# Patient Record
Sex: Male | Born: 1990 | Race: Black or African American | Hispanic: No | Marital: Single | Smoking: Never smoker
Health system: Southern US, Community
[De-identification: ages and names within clinical notes are randomized; demographics above are authoritative.]

## PROBLEM LIST (undated history)

## (undated) DIAGNOSIS — E119 Type 2 diabetes mellitus without complications: Secondary | ICD-10-CM

---
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
## 2011-05-07 ENCOUNTER — Emergency Department (HOSPITAL_COMMUNITY): Payer: Self-pay

## 2011-05-07 ENCOUNTER — Emergency Department (HOSPITAL_COMMUNITY)
Admission: EM | Admit: 2011-05-07 | Discharge: 2011-05-07 | Disposition: A | Discharge status: Home / Self Care | Payer: Self-pay | Attending: Emergency Medicine | Admitting: Emergency Medicine

## 2011-05-07 DIAGNOSIS — I428 Other cardiomyopathies: Secondary | ICD-10-CM | POA: Insufficient documentation

## 2011-05-07 DIAGNOSIS — X500XXA Overexertion from strenuous movement or load, initial encounter: Secondary | ICD-10-CM | POA: Insufficient documentation

## 2011-05-07 DIAGNOSIS — R0789 Other chest pain: Secondary | ICD-10-CM | POA: Insufficient documentation

## 2019-08-09 ENCOUNTER — Other Ambulatory Visit: Payer: Self-pay

## 2019-08-09 ENCOUNTER — Encounter: Payer: Self-pay | Admitting: *Deleted

## 2019-08-09 ENCOUNTER — Ambulatory Visit
Admission: EM | Admit: 2019-08-09 | Discharge: 2019-08-09 | Disposition: A | Discharge status: Home / Self Care | Payer: Self-pay | Attending: Emergency Medicine | Admitting: Emergency Medicine

## 2019-08-09 DIAGNOSIS — L02413 Cutaneous abscess of right upper limb: Secondary | ICD-10-CM

## 2019-08-09 DIAGNOSIS — L0291 Cutaneous abscess, unspecified: Secondary | ICD-10-CM

## 2019-08-09 MED ORDER — DOXYCYCLINE HYCLATE 100 MG PO CAPS: 100.0000 mg | ORAL_CAPSULE | Freq: Two times a day (BID) | ORAL | 0 refills | Status: AC

## 2019-08-09 NOTE — ED Provider Notes (Signed)
[facility]    CSN: [id number] Arrival date & time: 08/09/19  1148      History   Chief Complaint Chief Complaint  Patient presents with  . Abscess    HPI Scott Barton is a 28 y.o. male.  Presenting for right forearm abscess x2.  States pain and swelling developed about 5 days ago.  Patient did have tattoo completed on affected area 11/5 without adverse effect.  Patient has been using Aquaphor over tattoo to help with dry skin as well as Tylenol which did not help.  Patient denies fever, open wound, drainage, inciting event, painful ROM.  History reviewed. No pertinent past medical history.  There are no active problems to display for this patient.   History reviewed. No pertinent surgical history.     Home Medications    Prior to Admission medications   Medication Sig Start Date End Date Taking? Authorizing Provider  doxycycline (VIBRAMYCIN) 100 MG capsule Take 1 capsule (100 mg total) by mouth 2 (two) times daily for 7 days. 08/09/19 08/16/19  Hall-Potvin, [place], PA-C    Family History History reviewed. No pertinent family history.  Social History Social History   Tobacco Use  . Smoking status: Never Smoker  . Smokeless tobacco: Never Used  Substance Use Topics  . Alcohol use: Yes    Comment: occ  . Drug use: Not on file     Allergies   Patient has no known allergies.   Review of Systems Review of Systems  Constitutional: Negative for fatigue and fever.  Respiratory: Negative for cough and shortness of breath.   Cardiovascular: Negative for chest pain and palpitations.  Gastrointestinal: Negative for abdominal pain, diarrhea and vomiting.  Musculoskeletal: Negative for arthralgias and myalgias.  Skin: Positive for wound. Negative for rash.  Neurological: Negative for speech difficulty and headaches.  All other systems reviewed and are negative.    Physical Exam Triage Vital Signs ED Triage Vitals  Enc Vitals Group     BP       Pulse      Resp      Temp      Temp src      SpO2      Weight      Height      Head Circumference      Peak Flow      Pain Score      Pain Loc      Pain Edu?      Excl. in [place]?    No data found.  Updated Vital Signs BP (!) 151/97 (BP Location: Left Arm)   Pulse 97   Temp 98.1 F (36.7 C) (Oral)   Resp 16   SpO2 98%   Visual Acuity Right Eye Distance:   Left Eye Distance:   Bilateral Distance:    Right Eye Near:   Left Eye Near:    Bilateral Near:     Physical Exam Constitutional:      General: He is not in acute distress. HENT:     Head: Normocephalic and atraumatic.  Eyes:     General: No scleral icterus.    Pupils: Pupils are equal, round, and reactive to light.  Cardiovascular:     Rate and Rhythm: Normal rate.  Pulmonary:     Effort: Pulmonary effort is normal.  Skin:    Comments: Right ventral forearm w/ tattoo: 2.0 cm & 1 cm round firm lesions noted. Warm, red, tender.  No fluctuance or streaking.  No skin breakdown.  Neurological:     Mental Status: He is alert and oriented to person, place, and time.      UC Treatments / Results  Labs (all labs ordered are listed, but only abnormal results are displayed) Labs Reviewed - No data to display  EKG   Radiology No results found.  Procedures Procedures (including critical care time)  Medications Ordered in UC Medications - No data to display  Initial Impression / Assessment and Plan / UC Course  I have reviewed the triage vital signs and the nursing notes.  Pertinent labs & imaging results that were available during my care of the patient were reviewed by me and considered in my medical decision making (see chart for details).     Needle aspiration performed in office with largely sanguinous, scant purulent discharge.  Will treat with antibiotics, hot compresses outlined below.  Return precautions discussed, patient verbalized understanding and is agreeable to plan. Final Clinical  Impressions(s) / UC Diagnoses   Final diagnoses:  Abscess     Discharge Instructions     Keep area(s) clean and dry. Apply hot compress / towel for 5-10 minutes 3-5 times daily. Take antibiotic as prescribed with food - important to complete course. Return for worsening pain, redness, swelling, discharge, fever.  Helpful prevention tips: Keep nails short to avoid secondary skin infections. Use new, clean razors when shaving.    ED Prescriptions    Medication Sig Dispense Auth. Provider   doxycycline (VIBRAMYCIN) 100 MG capsule Take 1 capsule (100 mg total) by mouth 2 (two) times daily for 7 days. 14 capsule Hall-Potvin, [place], PA-C     PDMP not reviewed this encounter.   Hall-Potvin, [place], [place] 08/09/19 1349

## 2019-08-09 NOTE — Discharge Instructions (Addendum)
Keep area(s) clean and dry. °Apply hot compress / towel for 5-10 minutes 3-5 times daily. °Take antibiotic as prescribed with food - important to complete course. °Return for worsening pain, redness, swelling, discharge, fever. ° °Helpful prevention tips: °Keep nails short to avoid secondary skin infections. °Use new, clean razors when shaving. °

## 2019-08-09 NOTE — ED Triage Notes (Signed)
Patient got tattoo to right anterior forearm, November 5th, and has since developed 2 abscesses. Painful to the touch, red and warm. No fevers. No drainage.

## 2019-08-09 NOTE — ED Notes (Signed)
Patient able to ambulate independently  

## 2021-02-17 ENCOUNTER — Encounter (HOSPITAL_COMMUNITY): Payer: Self-pay

## 2021-02-17 ENCOUNTER — Other Ambulatory Visit: Payer: Self-pay

## 2021-02-17 ENCOUNTER — Emergency Department (HOSPITAL_COMMUNITY)
Admission: EM | Admit: 2021-02-17 | Discharge: 2021-02-18 | Disposition: A | Discharge status: Home / Self Care | Payer: Self-pay | Attending: Emergency Medicine | Admitting: Emergency Medicine

## 2021-02-17 DIAGNOSIS — R000 Tachycardia, unspecified: Secondary | ICD-10-CM | POA: Insufficient documentation

## 2021-02-17 DIAGNOSIS — R112 Nausea with vomiting, unspecified: Secondary | ICD-10-CM | POA: Insufficient documentation

## 2021-02-17 DIAGNOSIS — R1013 Epigastric pain: Secondary | ICD-10-CM | POA: Insufficient documentation

## 2021-02-17 DIAGNOSIS — E119 Type 2 diabetes mellitus without complications: Secondary | ICD-10-CM | POA: Insufficient documentation

## 2021-02-17 LAB — URINALYSIS, ROUTINE W REFLEX MICROSCOPIC
Bacteria, UA: NONE SEEN
Bilirubin Urine: NEGATIVE
Glucose, UA: >=500 mg/dL — AB
Hgb urine dipstick: NEGATIVE
Ketones, ur: 80 mg/dL — AB
Leukocytes,Ua: NEGATIVE
Nitrite: NEGATIVE
Protein, ur: NEGATIVE mg/dL
Specific Gravity, Urine: 1.011 (ref 1.005–1.030)
pH: 6 (ref 5.0–8.0)

## 2021-02-17 LAB — COMPREHENSIVE METABOLIC PANEL
ALT: 14 U/L (ref 0–44)
AST: 19 U/L (ref 15–41)
Albumin: 4.3 g/dL (ref 3.5–5.0)
Alkaline Phosphatase: 65 U/L (ref 38–126)
Anion gap: 17 — ABNORMAL HIGH (ref 5–15)
BUN: 8 mg/dL (ref 6–20)
CO2: 18 mmol/L — ABNORMAL LOW (ref 22–32)
Calcium: 9.8 mg/dL (ref 8.9–10.3)
Chloride: 99 mmol/L (ref 98–111)
Creatinine, Ser: 1 mg/dL (ref 0.61–1.24)
GFR, Estimated: >60 mL/min (ref >60)
Glucose, Bld: 295 mg/dL — ABNORMAL HIGH (ref 70–99)
Potassium: 3.4 mmol/L — ABNORMAL LOW (ref 3.5–5.1)
Sodium: 134 mmol/L — ABNORMAL LOW (ref 135–145)
Total Bilirubin: 1.4 mg/dL — ABNORMAL HIGH (ref 0.3–1.2)
Total Protein: 7.8 g/dL (ref 6.5–8.1)

## 2021-02-17 LAB — CBC WITH DIFFERENTIAL/PLATELET
Abs Immature Granulocytes: 0.05 10*3/uL (ref 0.00–0.07)
Basophils Absolute: 0 10*3/uL (ref 0.0–0.1)
Basophils Relative: 0 %
Eosinophils Absolute: 0 10*3/uL (ref 0.0–0.5)
Eosinophils Relative: 0 %
HCT: 48.9 % (ref 39.0–52.0)
Hemoglobin: 17.8 g/dL — ABNORMAL HIGH (ref 13.0–17.0)
Immature Granulocytes: 1 %
Lymphocytes Relative: 11 %
Lymphs Abs: 1.2 10*3/uL (ref 0.7–4.0)
MCH: 29.8 pg (ref 26.0–34.0)
MCHC: 36.4 g/dL — ABNORMAL HIGH (ref 30.0–36.0)
MCV: 81.9 fL (ref 80.0–100.0)
Monocytes Absolute: 0.5 10*3/uL (ref 0.1–1.0)
Monocytes Relative: 5 %
Neutro Abs: 8.9 10*3/uL — ABNORMAL HIGH (ref 1.7–7.7)
Neutrophils Relative %: 83 %
Platelets: 284 10*3/uL (ref 150–400)
RBC: 5.97 MIL/uL — ABNORMAL HIGH (ref 4.22–5.81)
RDW: 11.5 % (ref 11.5–15.5)
WBC: 10.8 10*3/uL — ABNORMAL HIGH (ref 4.0–10.5)
nRBC: 0 % (ref 0.0–0.2)

## 2021-02-17 LAB — LIPASE, BLOOD: Lipase: 20 U/L (ref 11–51)

## 2021-02-17 NOTE — ED Provider Notes (Signed)
Emergency Medicine Provider Triage Evaluation Note  Scott Barton , a 30 y.o. male  was evaluated in triage.  Pt complains of nv abd pain that started today.  Review of Systems  Positive: Nv, abd pain Negative: diarrhea  Physical Exam  BP (!) 160/120   Pulse (!) 121   Temp 97.7 F (36.5 C) (Oral)   Resp 20   SpO2 98%  Gen:   Awake, no distress   Resp:  Normal effort  MSK:   Moves extremities without difficulty  Other:  Epigastric abd ttp  Medical Decision Making  Medically screening exam initiated at 7:22 PM.  Appropriate orders placed.  Scott Barton was informed that the remainder of the evaluation will be completed by another provider, this initial triage assessment does not replace that evaluation, and the importance of remaining in the ED until their evaluation is complete.     Scott Barton 02/17/21 1923    Pricilla Loveless, MD 02/17/21 [phone]

## 2021-02-17 NOTE — ED Triage Notes (Signed)
Pt states that he ate something bad last night and has been vomiting ever since, denies diarrhea or fever.

## 2021-02-17 NOTE — ED Notes (Signed)
Pt complains of feeling like he is going to pass out. Patient hyperventilating. Explained to patient about slowing down his breathing. States he cant breathe unless he has the fast, labored breathing. Triage RN aware

## 2021-02-17 NOTE — ED Notes (Signed)
Patient states that vomit is now bloody

## 2021-02-18 ENCOUNTER — Observation Stay (HOSPITAL_COMMUNITY)
Admission: EM | Admit: 2021-02-18 | Discharge: 2021-02-19 | Disposition: A | Discharge status: Home / Self Care | Payer: Self-pay | Attending: Emergency Medicine | Admitting: Emergency Medicine

## 2021-02-18 ENCOUNTER — Emergency Department (HOSPITAL_COMMUNITY): Payer: Self-pay

## 2021-02-18 ENCOUNTER — Encounter (HOSPITAL_COMMUNITY): Payer: Self-pay | Admitting: Emergency Medicine

## 2021-02-18 DIAGNOSIS — R112 Nausea with vomiting, unspecified: Secondary | ICD-10-CM

## 2021-02-18 DIAGNOSIS — E1165 Type 2 diabetes mellitus with hyperglycemia: Secondary | ICD-10-CM

## 2021-02-18 DIAGNOSIS — R109 Unspecified abdominal pain: Secondary | ICD-10-CM | POA: Insufficient documentation

## 2021-02-18 DIAGNOSIS — E1065 Type 1 diabetes mellitus with hyperglycemia: Principal | ICD-10-CM | POA: Insufficient documentation

## 2021-02-18 DIAGNOSIS — Z79899 Other long term (current) drug therapy: Secondary | ICD-10-CM | POA: Insufficient documentation

## 2021-02-18 DIAGNOSIS — Z20822 Contact with and (suspected) exposure to covid-19: Secondary | ICD-10-CM | POA: Insufficient documentation

## 2021-02-18 DIAGNOSIS — IMO0002 Reserved for concepts with insufficient information to code with codable children: Secondary | ICD-10-CM

## 2021-02-18 DIAGNOSIS — E8729 Other acidosis: Secondary | ICD-10-CM

## 2021-02-18 DIAGNOSIS — Z7984 Long term (current) use of oral hypoglycemic drugs: Secondary | ICD-10-CM | POA: Insufficient documentation

## 2021-02-18 HISTORY — DX: Type 2 diabetes mellitus without complications: E11.9

## 2021-02-18 LAB — I-STAT VENOUS BLOOD GAS, ED
Acid-Base Excess: 2 mmol/L (ref 0.0–2.0)
Acid-base deficit: 2 mmol/L (ref 0.0–2.0)
Bicarbonate: 24.3 mmol/L (ref 20.0–28.0)
Bicarbonate: 18.8 mmol/L — ABNORMAL LOW (ref 20.0–28.0)
Calcium, Ion: 1.14 mmol/L — ABNORMAL LOW (ref 1.15–1.40)
Calcium, Ion: 1.1 mmol/L — ABNORMAL LOW (ref 1.15–1.40)
HCT: 50 % (ref 39.0–52.0)
HCT: 48 % (ref 39.0–52.0)
Hemoglobin: 17 g/dL (ref 13.0–17.0)
Hemoglobin: 16.3 g/dL (ref 13.0–17.0)
O2 Saturation: 61 %
O2 Saturation: 90 %
Patient temperature: 32
Potassium: 4.2 mmol/L (ref 3.5–5.1)
Potassium: 3.8 mmol/L (ref 3.5–5.1)
Sodium: 138 mmol/L (ref 135–145)
Sodium: 137 mmol/L (ref 135–145)
TCO2: 25 mmol/L (ref 22–32)
TCO2: 19 mmol/L — ABNORMAL LOW (ref 22–32)
pCO2, Ven: 31.3 mmHg — ABNORMAL LOW (ref 44.0–60.0)
pCO2, Ven: 19.2 mmHg — CL (ref 44.0–60.0)
pH, Ven: 7.498 — ABNORMAL HIGH (ref 7.250–7.430)
pH, Ven: 7.58 — ABNORMAL HIGH (ref 7.250–7.430)
pO2, Ven: 29 mmHg — CL (ref 32.0–45.0)
pO2, Ven: 37 mmHg (ref 32.0–45.0)

## 2021-02-18 LAB — LIPASE, BLOOD: Lipase: 20 U/L (ref 11–51)

## 2021-02-18 LAB — CBC WITH DIFFERENTIAL/PLATELET
Abs Immature Granulocytes: 0.08 10*3/uL — ABNORMAL HIGH (ref 0.00–0.07)
Basophils Absolute: 0 10*3/uL (ref 0.0–0.1)
Basophils Relative: 0 %
Eosinophils Absolute: 0 10*3/uL (ref 0.0–0.5)
Eosinophils Relative: 0 %
HCT: 47.6 % (ref 39.0–52.0)
Hemoglobin: 16.9 g/dL (ref 13.0–17.0)
Immature Granulocytes: 1 %
Lymphocytes Relative: 10 %
Lymphs Abs: 1.6 10*3/uL (ref 0.7–4.0)
MCH: 29.6 pg (ref 26.0–34.0)
MCHC: 35.5 g/dL (ref 30.0–36.0)
MCV: 83.4 fL (ref 80.0–100.0)
Monocytes Absolute: 0.9 10*3/uL (ref 0.1–1.0)
Monocytes Relative: 5 %
Neutro Abs: 13.6 10*3/uL — ABNORMAL HIGH (ref 1.7–7.7)
Neutrophils Relative %: 84 %
Platelets: 281 10*3/uL (ref 150–400)
RBC: 5.71 MIL/uL (ref 4.22–5.81)
RDW: 11.8 % (ref 11.5–15.5)
WBC: 16.2 10*3/uL — ABNORMAL HIGH (ref 4.0–10.5)
nRBC: 0 % (ref 0.0–0.2)

## 2021-02-18 LAB — RESP PANEL BY RT-PCR (FLU A&B, COVID) ARPGX2
Influenza A by PCR: NEGATIVE
Influenza B by PCR: NEGATIVE
SARS Coronavirus 2 by RT PCR: NEGATIVE

## 2021-02-18 LAB — COMPREHENSIVE METABOLIC PANEL
ALT: 15 U/L (ref 0–44)
AST: 16 U/L (ref 15–41)
Albumin: 4.3 g/dL (ref 3.5–5.0)
Alkaline Phosphatase: 65 U/L (ref 38–126)
Anion gap: 16 — ABNORMAL HIGH (ref 5–15)
BUN: 10 mg/dL (ref 6–20)
CO2: 17 mmol/L — ABNORMAL LOW (ref 22–32)
Calcium: 9.4 mg/dL (ref 8.9–10.3)
Chloride: 103 mmol/L (ref 98–111)
Creatinine, Ser: 1 mg/dL (ref 0.61–1.24)
GFR, Estimated: >60 mL/min (ref >60)
Glucose, Bld: 329 mg/dL — ABNORMAL HIGH (ref 70–99)
Potassium: 3.8 mmol/L (ref 3.5–5.1)
Sodium: 136 mmol/L (ref 135–145)
Total Bilirubin: 1.5 mg/dL — ABNORMAL HIGH (ref 0.3–1.2)
Total Protein: 7.6 g/dL (ref 6.5–8.1)

## 2021-02-18 LAB — CBG MONITORING, ED
Glucose-Capillary: 294 mg/dL — ABNORMAL HIGH (ref 70–99)
Glucose-Capillary: 276 mg/dL — ABNORMAL HIGH (ref 70–99)
Glucose-Capillary: 319 mg/dL — ABNORMAL HIGH (ref 70–99)
Glucose-Capillary: 286 mg/dL — ABNORMAL HIGH (ref 70–99)

## 2021-02-18 LAB — BASIC METABOLIC PANEL
Anion gap: 10 (ref 5–15)
BUN: 9 mg/dL (ref 6–20)
CO2: 20 mmol/L — ABNORMAL LOW (ref 22–32)
Calcium: 8.2 mg/dL — ABNORMAL LOW (ref 8.9–10.3)
Chloride: 107 mmol/L (ref 98–111)
Creatinine, Ser: 0.82 mg/dL (ref 0.61–1.24)
GFR, Estimated: >60 mL/min (ref >60)
Glucose, Bld: 282 mg/dL — ABNORMAL HIGH (ref 70–99)
Potassium: 3.7 mmol/L (ref 3.5–5.1)
Sodium: 137 mmol/L (ref 135–145)

## 2021-02-18 LAB — BETA-HYDROXYBUTYRIC ACID: Beta-Hydroxybutyric Acid: 2.19 mmol/L — ABNORMAL HIGH (ref 0.05–0.27)

## 2021-02-18 MED ORDER — ENOXAPARIN SODIUM 30 MG/0.3ML IJ SOSY
30.0000 mg | PREFILLED_SYRINGE | Freq: Every day | INTRAMUSCULAR | Status: DC
  Administered 2021-02-19: 30 mg via SUBCUTANEOUS
  Filled 2021-02-18: qty 0.3

## 2021-02-18 MED ORDER — DIPHENHYDRAMINE HCL 50 MG/ML IJ SOLN
12.5000 mg | Freq: Once | INTRAMUSCULAR | Status: AC
  Administered 2021-02-18: 12.5 mg via INTRAVENOUS
  Filled 2021-02-18: qty 1

## 2021-02-18 MED ORDER — METOCLOPRAMIDE HCL 5 MG/ML IJ SOLN
10.0000 mg | Freq: Once | INTRAMUSCULAR | Status: AC
  Administered 2021-02-18: 10 mg via INTRAVENOUS
  Filled 2021-02-18: qty 2

## 2021-02-18 MED ORDER — ONDANSETRON HCL 4 MG/2ML IJ SOLN: 4.0000 mg | Freq: Once | INTRAMUSCULAR | Status: AC

## 2021-02-18 MED ORDER — METOCLOPRAMIDE HCL 5 MG/ML IJ SOLN
10.0000 mg | Freq: Four times a day (QID) | INTRAMUSCULAR | Status: DC | PRN
  Administered 2021-02-19: 10 mg via INTRAVENOUS
  Filled 2021-02-18: qty 2

## 2021-02-18 MED ORDER — PANTOPRAZOLE SODIUM 40 MG IV SOLR
40.0000 mg | Freq: Once | INTRAVENOUS | Status: AC
  Administered 2021-02-18: 40 mg via INTRAVENOUS
  Filled 2021-02-18: qty 40

## 2021-02-18 MED ORDER — ACETAMINOPHEN 325 MG PO TABS
650.0000 mg | ORAL_TABLET | Freq: Four times a day (QID) | ORAL | Status: DC | PRN
  Administered 2021-02-19: 650 mg via ORAL
  Filled 2021-02-18: qty 2

## 2021-02-18 MED ORDER — INSULIN ASPART 100 UNIT/ML IJ SOLN
0.0000 [IU] | INTRAMUSCULAR | Status: DC
  Administered 2021-02-19: 3 [IU] via SUBCUTANEOUS
  Administered 2021-02-19: 2 [IU] via SUBCUTANEOUS
  Administered 2021-02-19: 5 [IU] via SUBCUTANEOUS

## 2021-02-18 MED ORDER — ONDANSETRON 4 MG PO TBDP: 4.0000 mg | ORAL_TABLET | Freq: Three times a day (TID) | ORAL | 0 refills | Status: DC | PRN

## 2021-02-18 MED ORDER — IOHEXOL 300 MG/ML  SOLN
100.0000 mL | Freq: Once | INTRAMUSCULAR | Status: AC | PRN
  Administered 2021-02-18: 100 mL via INTRAVENOUS

## 2021-02-18 MED ORDER — MORPHINE SULFATE (PF) 4 MG/ML IV SOLN
4.0000 mg | Freq: Once | INTRAVENOUS | Status: AC
  Administered 2021-02-18: 4 mg via INTRAVENOUS
  Filled 2021-02-18: qty 1

## 2021-02-18 MED ORDER — PROCHLORPERAZINE EDISYLATE 10 MG/2ML IJ SOLN
10.0000 mg | Freq: Once | INTRAMUSCULAR | Status: AC
  Administered 2021-02-18: 10 mg via INTRAVENOUS
  Filled 2021-02-18: qty 2

## 2021-02-18 MED ORDER — ONDANSETRON HCL 4 MG/2ML IJ SOLN
INTRAMUSCULAR | Status: AC
  Filled 2021-02-18: qty 2

## 2021-02-18 MED ORDER — METFORMIN HCL 500 MG PO TABS: 500.0000 mg | ORAL_TABLET | Freq: Two times a day (BID) | ORAL | 0 refills | Status: DC

## 2021-02-18 MED ORDER — ACETAMINOPHEN 650 MG RE SUPP: 650.0000 mg | Freq: Four times a day (QID) | RECTAL | Status: DC | PRN

## 2021-02-18 MED ORDER — POTASSIUM CHLORIDE 2 MEQ/ML IV SOLN
INTRAVENOUS | Status: AC
  Filled 2021-02-18 (×2): qty 1000

## 2021-02-18 MED ORDER — LACTATED RINGERS IV BOLUS
2000.0000 mL | Freq: Once | INTRAVENOUS | Status: AC
  Administered 2021-02-18: 2000 mL via INTRAVENOUS

## 2021-02-18 MED ORDER — SODIUM CHLORIDE 0.9 % IV BOLUS
2000.0000 mL | Freq: Once | INTRAVENOUS | Status: AC
  Administered 2021-02-18: 2000 mL via INTRAVENOUS

## 2021-02-18 MED ORDER — INSULIN ASPART 100 UNIT/ML IJ SOLN: 8.0000 [IU] | Freq: Once | INTRAMUSCULAR | Status: DC

## 2021-02-18 NOTE — Discharge Instructions (Addendum)
You have been diagnosed with diabetes and will need to be on medications from now on to control your blood sugar. Take Metformin (glucophage) as directed and take Zofran for nausea as needed.   Check your blood sugar frequently to insure you are maintaining control.   Follow up with primary care doctor of your choice for ongoing management/treatment. If you do not have a primary care physician, you can call the [facility] for an appointment.   Return to the emergency department if you develop recurrent/uncontrolled symptoms of nausea, vomiting, if you blood sugar climbs high or for new concern.

## 2021-02-18 NOTE — ED Notes (Signed)
Pt is actively vomiting, provider aware

## 2021-02-18 NOTE — ED Provider Notes (Signed)
[facility] EMERGENCY DEPARTMENT Provider Note   CSN: [id number] Arrival date & time: 02/17/21  1909     History Chief Complaint  Patient presents with   Abdominal Pain    Scott Barton is a 30 y.o. male.  Patient to the ED with nausea, vomiting and abdominal pain that started yesterday (6/8). He states that he has been having frequent episodes of same symptoms, usually after eating anything, for the past several weeks. No hematemesis, constipation or diarrhea. No fever.   The history is provided by the patient. No language interpreter was used.  Abdominal Pain Associated symptoms: nausea and vomiting   Associated symptoms: no chest pain, no constipation, no cough, no diarrhea, no fever and no shortness of breath       History reviewed. No pertinent past medical history.  There are no problems to display for this patient.   History reviewed. No pertinent surgical history.     No family history on file.  Social History   Tobacco Use   Smoking status: Never   Smokeless tobacco: Never  Substance Use Topics   Alcohol use: Yes    Comment: occ    Home Medications Prior to Admission medications   Not on File    Allergies    Patient has no known allergies.  Review of Systems   Review of Systems  Constitutional:  Negative for fever.  Respiratory:  Negative for cough and shortness of breath.   Cardiovascular:  Negative for chest pain.  Gastrointestinal:  Positive for abdominal pain, nausea and vomiting. Negative for constipation and diarrhea.  Musculoskeletal:  Negative for myalgias.  Neurological:  Negative for weakness.   Physical Exam Updated Vital Signs BP (!) 172/87   Pulse 85   Temp 98.4 F (36.9 C) (Oral)   Resp 16   SpO2 100%   Physical Exam Vitals and nursing note reviewed.  Constitutional:      Appearance: He is well-developed. He is ill-appearing.  HENT:     Head: Normocephalic.  Cardiovascular:     Rate and Rhythm:  Regular rhythm. Tachycardia present.  Pulmonary:     Effort: Pulmonary effort is normal.     Breath sounds: Normal breath sounds. No wheezing, rhonchi or rales.  Abdominal:     General: Bowel sounds are normal.     Palpations: Abdomen is soft.     Tenderness: There is abdominal tenderness in the epigastric area and left upper quadrant. There is no guarding or rebound.  Musculoskeletal:        General: Normal range of motion.     Cervical back: Normal range of motion and neck supple.  Skin:    General: Skin is warm and dry.  Neurological:     General: No focal deficit present.     Mental Status: He is alert and oriented to person, place, and time.    ED Results / Procedures / Treatments   Labs (all labs ordered are listed, but only abnormal results are displayed) Labs Reviewed  COMPREHENSIVE METABOLIC PANEL - Abnormal; Notable for the following components:      Result Value   Sodium 134 (*)    Potassium 3.4 (*)    CO2 18 (*)    Glucose, Bld 295 (*)    Total Bilirubin 1.4 (*)    Anion gap 17 (*)    All other components within normal limits  CBC WITH DIFFERENTIAL/PLATELET - Abnormal; Notable for the following components:   WBC 10.8 (*)  RBC 5.97 (*)    Hemoglobin 17.8 (*)    MCHC 36.4 (*)    Neutro Abs 8.9 (*)    All other components within normal limits  URINALYSIS, ROUTINE W REFLEX MICROSCOPIC - Abnormal; Notable for the following components:   Glucose, UA >=500 (*)    Ketones, ur 80 (*)    All other components within normal limits  I-STAT VENOUS BLOOD GAS, ED - Abnormal; Notable for the following components:   pH, Ven 7.498 (*)    pCO2, Ven 31.3 (*)    pO2, Ven 29.0 (*)    Calcium, Ion 1.14 (*)    All other components within normal limits  LIPASE, BLOOD  CBG MONITORING, ED   Results for orders placed or performed during the hospital encounter of 02/17/21  Comprehensive metabolic panel  Result Value Ref Range   Sodium 134 (L) 135 - 145 mmol/L   Potassium 3.4  (L) 3.5 - 5.1 mmol/L   Chloride 99 98 - 111 mmol/L   CO2 18 (L) 22 - 32 mmol/L   Glucose, Bld 295 (H) 70 - 99 mg/dL   BUN 8 6 - 20 mg/dL   Creatinine, Ser 4.09 0.61 - 1.24 mg/dL   Calcium 9.8 8.9 - 81.1 mg/dL   Total Protein 7.8 6.5 - 8.1 g/dL   Albumin 4.3 3.5 - 5.0 g/dL   AST 19 15 - 41 U/L   ALT 14 0 - 44 U/L   Alkaline Phosphatase 65 38 - 126 U/L   Total Bilirubin 1.4 (H) 0.3 - 1.2 mg/dL   GFR, Estimated >91 >47 mL/min   Anion gap 17 (H) 5 - 15  Lipase, blood  Result Value Ref Range   Lipase 20 11 - 51 U/L  CBC with Differential  Result Value Ref Range   WBC 10.8 (H) 4.0 - 10.5 K/uL   RBC 5.97 (H) 4.22 - 5.81 MIL/uL   Hemoglobin 17.8 (H) 13.0 - 17.0 g/dL   HCT 82.9 56.2 - 13.0 %   MCV 81.9 80.0 - 100.0 fL   MCH 29.8 26.0 - 34.0 pg   MCHC 36.4 (H) 30.0 - 36.0 g/dL   RDW 86.5 78.4 - 69.6 %   Platelets 284 150 - 400 K/uL   nRBC 0.0 0.0 - 0.2 %   Neutrophils Relative % 83 %   Neutro Abs 8.9 (H) 1.7 - 7.7 K/uL   Lymphocytes Relative 11 %   Lymphs Abs 1.2 0.7 - 4.0 K/uL   Monocytes Relative 5 %   Monocytes Absolute 0.5 0.1 - 1.0 K/uL   Eosinophils Relative 0 %   Eosinophils Absolute 0.0 0.0 - 0.5 K/uL   Basophils Relative 0 %   Basophils Absolute 0.0 0.0 - 0.1 K/uL   Immature Granulocytes 1 %   Abs Immature Granulocytes 0.05 0.00 - 0.07 K/uL  Urinalysis, Routine w reflex microscopic Urine, Clean Catch  Result Value Ref Range   Color, Urine YELLOW YELLOW   APPearance CLEAR CLEAR   Specific Gravity, Urine 1.011 1.005 - 1.030   pH 6.0 5.0 - 8.0   Glucose, UA >=500 (A) NEGATIVE mg/dL   Hgb urine dipstick NEGATIVE NEGATIVE   Bilirubin Urine NEGATIVE NEGATIVE   Ketones, ur 80 (A) NEGATIVE mg/dL   Protein, ur NEGATIVE NEGATIVE mg/dL   Nitrite NEGATIVE NEGATIVE   Leukocytes,Ua NEGATIVE NEGATIVE   RBC / HPF 0-5 0 - 5 RBC/hpf   WBC, UA 0-5 0 - 5 WBC/hpf   Bacteria, UA NONE SEEN NONE SEEN  Squamous Epithelial / LPF 0-5 0 - 5   Mucus PRESENT   I-Stat venous blood gas,  ED  Result Value Ref Range   pH, Ven 7.498 (H) 7.250 - 7.430   pCO2, Ven 31.3 (L) 44.0 - 60.0 mmHg   pO2, Ven 29.0 (LL) 32.0 - 45.0 mmHg   Bicarbonate 24.3 20.0 - 28.0 mmol/L   TCO2 25 22 - 32 mmol/L   O2 Saturation 61.0 %   Acid-Base Excess 2.0 0.0 - 2.0 mmol/L   Sodium 138 135 - 145 mmol/L   Potassium 4.2 3.5 - 5.1 mmol/L   Calcium, Ion 1.14 (L) 1.15 - 1.40 mmol/L   HCT 50.0 39.0 - 52.0 %   Hemoglobin 17.0 13.0 - 17.0 g/dL   Sample type VENOUS    Comment NOTIFIED PHYSICIAN   CBG monitoring, ED  Result Value Ref Range   Glucose-Capillary 294 (H) 70 - 99 mg/dL    EKG None  Radiology No results found.  Procedures Procedures   Medications Ordered in ED Medications  ondansetron (ZOFRAN) 4 MG/2ML injection (has no administration in time range)  metoCLOPramide (REGLAN) injection 10 mg (has no administration in time range)    ED Course  I have reviewed the triage vital signs and the nursing notes.  Pertinent labs & imaging results that were available during my care of the patient were reviewed by me and considered in my medical decision making (see chart for details).    MDM Rules/Calculators/A&P                          Patient to ED with N, V, AP, off on and for the past several weeks. No fever.   His CBG is found to be elevated at 295. No previous history of diabetes but he states there are several family members that have it. He also states his mother has checked his blood sugar on multiple occasions and found it to be elevated.   IV boluses started. CO2 is 18 with gap of 17. VBG with normal pH, so not in DKA. Plan to recheck his blood sugar after aggressive fluid hydration. Anticipate being able to discharge home on oral agents with PCP follow up to continue management.   Zofran without significant relief of nausea. Reglan provided.   On recheck, patient reports nausea and abdominal pain have resolved.   Recheck CBG 276. Will start Metformin 500 mg BID. Mom has a  glucometer at home that can be used until the patient is established for diabetes care with PCP. Referral provided.   Final Clinical Impression(s) / ED Diagnoses Final diagnoses:  None   New onset diabetes  Rx / DC Orders ED Discharge Orders     None        Elpidio Anis, PA-C 02/21/21 0746    Long, Arlyss Repress, MD 02/22/21 [phone]

## 2021-02-18 NOTE — ED Notes (Signed)
Patient transported to CT 

## 2021-02-18 NOTE — ED Provider Notes (Signed)
Emergency Medicine Provider Triage Evaluation Note  Scott Barton , a 30 y.o. male  was evaluated in triage.  Pt complains of emesis. Seen here earlier today for new onset DM without DKA. Continued emesis at home unrelieved with Zofran. Now with blood in emesis, bright in color. No lightheadedness. Continued epigastric pain. No hx of EtOh use or esophageal varices, anticoagulation. No NSAID use. Does have cough. Daily MJ use however no use in 3 days due to emesis.   Review of Systems  Positive: emesis Negative: Lightheadedness, diarrhea  Physical Exam  BP (!) 193/114 (BP Location: Right Arm)   Pulse (!) 105   Temp 98 F (36.7 C) (Oral)   Resp 18   SpO2 100%  Gen:   Awake. Active emesis, appear uncomfortable Resp:  Normal effort  MSK:   Moves extremities without difficulty  Abd:  Diffuse tenderness Other:    Medical Decision Making  Medically screening exam initiated at 2:17 PM.  Appropriate orders placed.  Scott Barton was informed that the remainder of the evaluation will be completed by another provider, this initial triage assessment does not replace that evaluation, and the importance of remaining in the ED until their evaluation is complete.  Emesis, abd pain  Patient next for room in back. Nursing aware.   Linwood Dibbles, PA-C 02/18/21 1418    Wynetta Fines, MD 02/21/21 1125

## 2021-02-18 NOTE — ED Notes (Signed)
Patient transported to X-ray 

## 2021-02-18 NOTE — H&P (Signed)
History and Physical    Scott Barton:[id number] DOB: 1991-06-04 DOA: 02/18/2021  PCP: Patient, No Pcp Per (Inactive)  Patient coming from: Home  I have personally briefly reviewed patient's old medical records in [place]  Chief Complaint: Abdominal pain, nausea, vomiting  HPI: Scott Barton is a 30 y.o. male with medical history significant for recently diagnosed diabetes who presents to the ED for evaluation of abdominal pain, nausea, and vomiting.  Patient initially came to the ED evening of 02/17/2021 for abdominal pain, nausea, and vomiting.  Labs showed hyperglycemia with serum glucose 295, bicarb 18, and anion gap of 17. Urinalysis showed greater than 500 glucose and 80 ketones.  VBG showed pH 7.498, PCO2 21.3, PO2 29.  He is not felt to be in DKA.  He was treated with 2 L normal saline, IV Zofran, and IV Reglan with symptomatic improvement.  He was discharged to home with prescriptions for metformin 500 mg twice daily and Zofran as needed.  Patient states his symptoms returned at home and were unrelieved by the nitroglycerin therefore he came to the ED for further evaluation.  He says the symptoms all began over Memorial Day weekend when he went to several barbecues during the weekend.  He noticed that shortly after eating he would begin to have abdominal bloating with frequent belching.  Over the last few days he has had frequent nausea with watery emesis.  He was having crampy abdominal pain.  He has not had any diarrhea.  He has been drinking a lot of water and has less urine output than he expects.  He has not had any dysuria.  He is not currently taking any medications regularly.  ED Course:  Initial vitals show BP 193/114, pulse 105, RR 18, temp 98.0 F, SPO2 100% on room air.  While in the ED he has been intermittently tachypneic.  Labs show sodium 136, potassium 3.8, bicarb 17, BUN 10, creatinine 1.00, serum glucose 329, anion gap 16, AST 16, ALT 15, alk phos 65,  total bilirubin 1.5, lipase 20, WBC 16.2, hemoglobin 16.9, platelets 281,000.  VBG showed pH 7.580, PCO2 19.2, PO2 37.  Beta hydroxybutyrate 2.19.  SARS-CoV-2 PCR panel ordered and pending.  Abdominal x-ray was negative for acute pathology.  CT abdomen/pelvis with contrast was negative for specific cause of abdominal pain.  Congenitally short pedicles in the lumbar spine with degenerative disc disease resulting in impingement of L5-S1 and potentially also at L3-4 and L4-5 noted.  Hypodensity in the left hepatic lobe noted, felt to represent focal steatosis although nonspecific.  Patient was given 2 L LR, IV morphine, IV Compazine, IV Reglan, IV Protonix, and IV Benadryl.  He was given 8 units subcutaneous NovoLog.  The hospitalist service was consulted to admit for further evaluation and management.  Review of Systems: All systems reviewed and are negative except as documented in history of present illness above.   Past Medical History:  Diagnosis Date   Diabetes mellitus without complication ([place])     No past surgical history on file.  Social History:  reports that he has never smoked. He has never used smokeless tobacco. He reports current alcohol use. No history on file for drug use.  No Known Allergies  Family History  Problem Relation Age of Onset   Diabetes Mother    Kidney disease Mother        kidney transplant   Diabetes Sister      Prior to Admission medications  Medication Sig Start Date End Date Taking? Authorizing Provider  metFORMIN (GLUCOPHAGE) 500 MG tablet Take 1 tablet (500 mg total) by mouth 2 (two) times daily with a meal. 02/18/21   Upstill, Shari, PA-C  ondansetron (ZOFRAN ODT) 4 MG disintegrating tablet Take 1 tablet (4 mg total) by mouth every 8 (eight) hours as needed for nausea or vomiting. 02/18/21   Charlann Lange, PA-C    Physical Exam: Vitals:   02/18/21 1900 02/18/21 1911 02/18/21 1912 02/18/21 2000  BP: (!) 187/100   (!) 150/92  Pulse: 86 (!)  117 (!) 132 87  Resp:  (!) 29 (!) 25 20  Temp:      TempSrc:      SpO2: 99% 100% 100% 97%   Constitutional: Resting supine in bed, NAD, calm, comfortable Eyes: PERRL, lids and conjunctivae normal ENMT: Mucous membranes are moist. Posterior pharynx clear of any exudate or lesions.Normal dentition.  Neck: normal, supple, no masses. Respiratory: clear to auscultation bilaterally, no wheezing, no crackles. Normal respiratory effort. No accessory muscle use.  Cardiovascular: Regular rate and rhythm, no murmurs / rubs / gallops. No extremity edema. 2+ pedal pulses. Abdomen: Mild upper abdominal tenderness, no masses palpated. No hepatosplenomegaly. Bowel sounds positive.  Musculoskeletal: no clubbing / cyanosis. No joint deformity upper and lower extremities. Good ROM, no contractures. Normal muscle tone.  Skin: diaphoretic, no rashes, lesions, ulcers. No induration Neurologic: CN 2-12 grossly intact. Sensation intact. Strength 5/5 in all 4.  Psychiatric: Normal judgment and insight. Alert and oriented x 3. Normal mood.   Labs on Admission: I have personally reviewed following labs and imaging studies  CBC: Recent Labs  Lab 02/17/21 1924 02/18/21 0151 02/18/21 1420 02/18/21 1433  WBC 10.8*  --  16.2*  --   NEUTROABS 8.9*  --  13.6*  --   HGB 17.8* 17.0 16.9 16.3  HCT 48.9 50.0 47.6 48.0  MCV 81.9  --  83.4  --   PLT 284  --  281  --    Basic Metabolic Panel: Recent Labs  Lab 02/17/21 1924 02/18/21 0151 02/18/21 0508 02/18/21 1420 02/18/21 1433  NA 134* 138 137 136 137  K 3.4* 4.2 3.7 3.8 3.8  CL 99  --  107 103  --   CO2 18*  --  20* 17*  --   GLUCOSE 295*  --  282* 329*  --   BUN 8  --  9 10  --   CREATININE 1.00  --  0.82 1.00  --   CALCIUM 9.8  --  8.2* 9.4  --    GFR: CrCl cannot be calculated (Unknown ideal weight.). Liver Function Tests: Recent Labs  Lab 02/17/21 1924 02/18/21 1420  AST 19 16  ALT 14 15  ALKPHOS 65 65  BILITOT 1.4* 1.5*  PROT 7.8 7.6   ALBUMIN 4.3 4.3   Recent Labs  Lab 02/17/21 1924 02/18/21 1420  LIPASE 20 20   No results for input(s): AMMONIA in the last 168 hours. Coagulation Profile: No results for input(s): INR, PROTIME in the last 168 hours. Cardiac Enzymes: No results for input(s): CKTOTAL, CKMB, CKMBINDEX, TROPONINI in the last 168 hours. BNP (last 3 results) No results for input(s): PROBNP in the last 8760 hours. HbA1C: No results for input(s): HGBA1C in the last 72 hours. CBG: Recent Labs  Lab 02/18/21 0250 02/18/21 0446 02/18/21 1422 02/18/21 2214  GLUCAP 294* 276* 319* 286*   Lipid Profile: No results for input(s): CHOL, HDL, LDLCALC, TRIG, CHOLHDL,  LDLDIRECT in the last 72 hours. Thyroid Function Tests: No results for input(s): TSH, T4TOTAL, FREET4, T3FREE, THYROIDAB in the last 72 hours. Anemia Panel: No results for input(s): VITAMINB12, FOLATE, FERRITIN, TIBC, IRON, RETICCTPCT in the last 72 hours. Urine analysis:    Component Value Date/Time   COLORURINE YELLOW 02/17/2021 1924   APPEARANCEUR CLEAR 02/17/2021 1924   LABSPEC 1.011 02/17/2021 1924   PHURINE 6.0 02/17/2021 1924   GLUCOSEU >=500 (A) 02/17/2021 1924   HGBUR NEGATIVE 02/17/2021 1924   BILIRUBINUR NEGATIVE 02/17/2021 1924   KETONESUR 80 (A) 02/17/2021 1924   PROTEINUR NEGATIVE 02/17/2021 1924   NITRITE NEGATIVE 02/17/2021 1924   LEUKOCYTESUR NEGATIVE 02/17/2021 1924    Radiological Exams on Admission: CT ABDOMEN PELVIS W CONTRAST  Result Date: 02/18/2021 CLINICAL DATA:  Acute abdominal pain EXAM: CT ABDOMEN AND PELVIS WITH CONTRAST TECHNIQUE: Multidetector CT imaging of the abdomen and pelvis was performed using the standard protocol following bolus administration of intravenous contrast. CONTRAST:  157m OMNIPAQUE IOHEXOL 300 MG/ML  SOLN COMPARISON:  Radiograph 02/18/2021 FINDINGS: Lower chest: No significant abnormality. Hepatobiliary: Hypodensity in the left hepatic lobe along the falciform ligament persists on  delayed phase images and is probably from focal steatosis given the location, although is technically nonspecific. Gallbladder unremarkable. Pancreas: Unremarkable Spleen: Unremarkable Adrenals/Urinary Tract: Unremarkable Stomach/Bowel: Unremarkable.  Appendix appears normal. Vascular/Lymphatic: Unremarkable Reproductive: Unremarkable Other: No supplemental non-categorized findings. Musculoskeletal: Degenerative disc disease at L5-S1, advanced for age. Congenitally short pedicles in the lumbar spine along with degenerative disc disease resulting in impingement bilaterally at L5-S1 and potentially also at L3-4 and L4-5. Schmorl's node along the superior endplate of S1. Mildly transitional S1 vertebra. IMPRESSION: 1. A specific cause for the patient's abdominal pain is not identified. 2. Congenitally short pedicles in the lumbar spine with degenerative disc disease resulting in impingement at L5-S1 and potentially also at L3-4 and L4-5. 3. Hypodensity in the left hepatic lobe along the falciform ligament probably represents focal steatosis given this location, although is technically nonspecific. Electronically Signed   By: WVan ClinesM.D.   On: 02/18/2021 19:20   DG Abdomen Acute W/Chest  Result Date: 02/18/2021 CLINICAL DATA:  cough, emesis, epigastric pain EXAM: DG ABDOMEN ACUTE WITH 1 VIEW CHEST COMPARISON:  None. FINDINGS: There is no evidence of dilated bowel loops or free intraperitoneal air. There is scattered radiopaque material of the ascending and transverse colon. Heart size and mediastinal contours are within normal limits. Both lungs are clear. No acute osseous abnormality. IMPRESSION: Negative abdominal radiographs.  No acute cardiopulmonary disease. Electronically Signed   By: JMaurine Simmering  On: 02/18/2021 16:42    EKG: Personally reviewed. Sinus tachycardia, rate 120.  Rate is faster when compared to prior from 2012.  Assessment/Plan Principal Problem:   Nausea & vomiting Active  Problems:   Hyperglycemia due to diabetes mellitus (HCC)   Scott KOZAKis a 30y.o. male with medical history significant for recently diagnosed diabetes who is admitted with hyperglycemia and nausea and vomiting.  Hyperglycemia with new onset diabetes: Although pH is alkalotic, suspect he may have mild DKA with possible compensatory component.  Labs significant for serum glucose >250, high anion gap, ketonuria, and mildly elevated beta hydroxybutyrate. -Receiving 8 units NovoLog then start SSI q4h -Start continuous IV fluid hydration overnight -Check A1c and repeat BMP in a.m.  Abdominal pain with nausea and vomiting: CT abdomen/pelvis negative for acute finding.  Suspect related to hyperglycemia.  Possible early gastroparesis. -Continue IV fluid hydration and  correction of hyperglycemia as above -IV Reglan as needed -Advance diet as tolerated  Lumbar disc disease: Congenitally short pedicles in the lumbar spine with degenerative disc disease resulting in impingement of L5-S1 and potentially also at L3-4 and L4-5 noted on CT imaging.  DVT prophylaxis: Lovenox Code Status: Full code Family Communication: Discussed with patient's mother at bedside Disposition Plan: From home and likely discharge to home pending clinical progress/ability maintain adequate oral intake Consults called: None Level of care: Telemetry Medical Admission status:  Status is: Observation  The patient remains OBS appropriate and will d/c before 2 midnights.  Dispo: The patient is from: Home              Anticipated d/c is to: Home              Patient currently is not medically stable to d/c.   Difficult to place patient No   Zada Finders MD [facility]  If 7PM-7AM, please contact night-coverage [email or web address]  02/18/2021, 10:19 PM

## 2021-02-18 NOTE — ED Provider Notes (Signed)
[facility] EMERGENCY DEPARTMENT Provider Note   CSN: [id number] Arrival date & time: 02/18/21  1403     History Chief Complaint  Patient presents with   Emesis    Scott Barton is a 30 y.o. male.   Emesis  Patient presents to the ED for evaluation of persistent vomiting and upper abdominal pain.  Patient was seen in the emergency room early this morning. He was seen for issues with nausea vomiting and abdominal pain.  Patient had a work-up that included laboratory test that showed he does have diabetes.  Patient was noted to have an anion gap acidosis however his venous blood gas was normal.  Patient was not felt to be in DKA.  Patient ultimately felt better after treatment and he was discharged with prescription for Zofran.  Patient states when he went home that medication did not work and he started feeling worse.  He started vomiting again.  Patient also noticed some blood in his emesis.  He continues to have pain in his upper abdomen and feels like he cannot keep anything down Past Medical History:  Diagnosis Date   Diabetes mellitus without complication (HCC)     There are no problems to display for this patient.   No past surgical history on file.     No family history on file.  Social History   Tobacco Use   Smoking status: Never   Smokeless tobacco: Never  Substance Use Topics   Alcohol use: Yes    Comment: occ    Home Medications Prior to Admission medications   Medication Sig Start Date End Date Taking? Authorizing Provider  metFORMIN (GLUCOPHAGE) 500 MG tablet Take 1 tablet (500 mg total) by mouth 2 (two) times daily with a meal. 02/18/21   Upstill, Shari, PA-C  ondansetron (ZOFRAN ODT) 4 MG disintegrating tablet Take 1 tablet (4 mg total) by mouth every 8 (eight) hours as needed for nausea or vomiting. 02/18/21   Elpidio Anis, PA-C    Allergies    Patient has no known allergies.  Review of Systems   Review of Systems   Gastrointestinal:  Positive for vomiting.  All other systems reviewed and are negative.  Physical Exam Updated Vital Signs BP (!) 150/92   Pulse 87   Temp 98 F (36.7 C) (Oral)   Resp 20   SpO2 97%   Physical Exam Vitals and nursing note reviewed.  Constitutional:      Appearance: He is well-developed. He is ill-appearing.  HENT:     Head: Normocephalic and atraumatic.     Right Ear: External ear normal.     Left Ear: External ear normal.  Eyes:     General: No scleral icterus.       Right eye: No discharge.        Left eye: No discharge.     Conjunctiva/sclera: Conjunctivae normal.  Neck:     Trachea: No tracheal deviation.  Cardiovascular:     Rate and Rhythm: Normal rate and regular rhythm.  Pulmonary:     Effort: Pulmonary effort is normal. No respiratory distress.     Breath sounds: Normal breath sounds. No stridor. No wheezing or rales.  Abdominal:     General: Bowel sounds are normal. There is no distension.     Palpations: Abdomen is soft.     Tenderness: There is abdominal tenderness in the epigastric area. There is no guarding or rebound.  Musculoskeletal:  General: No tenderness.     Cervical back: Neck supple.  Skin:    General: Skin is warm and dry.     Findings: No rash.  Neurological:     Mental Status: He is alert.     Cranial Nerves: No cranial nerve deficit (no facial droop, extraocular movements intact, no slurred speech).     Sensory: No sensory deficit.     Motor: No abnormal muscle tone or seizure activity.     Coordination: Coordination normal.    ED Results / Procedures / Treatments   Labs (all labs ordered are listed, but only abnormal results are displayed) Labs Reviewed  CBC WITH DIFFERENTIAL/PLATELET - Abnormal; Notable for the following components:      Result Value   WBC 16.2 (*)    Neutro Abs 13.6 (*)    Abs Immature Granulocytes 0.08 (*)    All other components within normal limits  COMPREHENSIVE METABOLIC PANEL -  Abnormal; Notable for the following components:   CO2 17 (*)    Glucose, Bld 329 (*)    Total Bilirubin 1.5 (*)    Anion gap 16 (*)    All other components within normal limits  BETA-HYDROXYBUTYRIC ACID - Abnormal; Notable for the following components:   Beta-Hydroxybutyric Acid 2.19 (*)    All other components within normal limits  I-STAT VENOUS BLOOD GAS, ED - Abnormal; Notable for the following components:   pH, Ven 7.580 (*)    pCO2, Ven 19.2 (*)    Bicarbonate 18.8 (*)    TCO2 19 (*)    Calcium, Ion 1.10 (*)    All other components within normal limits  CBG MONITORING, ED - Abnormal; Notable for the following components:   Glucose-Capillary 319 (*)    All other components within normal limits  RESP PANEL BY RT-PCR (FLU A&B, COVID) ARPGX2  LIPASE, BLOOD    EKG None  Radiology CT ABDOMEN PELVIS W CONTRAST  Result Date: 02/18/2021 CLINICAL DATA:  Acute abdominal pain EXAM: CT ABDOMEN AND PELVIS WITH CONTRAST TECHNIQUE: Multidetector CT imaging of the abdomen and pelvis was performed using the standard protocol following bolus administration of intravenous contrast. CONTRAST:  OMNIPAQUE IOHEXOL 300 MG/ML  SOLN COMPARISON:  Radiograph 02/18/2021 FINDINGS: Lower chest: No significant abnormality. Hepatobiliary: Hypodensity in the left hepatic lobe along the falciform ligament persists on delayed phase images and is probably from focal steatosis given the location, although is technically nonspecific. Gallbladder unremarkable. Pancreas: Unremarkable Spleen: Unremarkable Adrenals/Urinary Tract: Unremarkable Stomach/Bowel: Unremarkable.  Appendix appears normal. Vascular/Lymphatic: Unremarkable Reproductive: Unremarkable Other: No supplemental non-categorized findings. Musculoskeletal: Degenerative disc disease at L5-S1, advanced for age. Congenitally short pedicles in the lumbar spine along with degenerative disc disease resulting in impingement bilaterally at L5-S1 and potentially  also at L3-4 and L4-5. Schmorl's node along the superior endplate of S1. Mildly transitional S1 vertebra. IMPRESSION: 1. A specific cause for the patient's abdominal pain is not identified. 2. Congenitally short pedicles in the lumbar spine with degenerative disc disease resulting in impingement at L5-S1 and potentially also at L3-4 and L4-5. 3. Hypodensity in the left hepatic lobe along the falciform ligament probably represents focal steatosis given this location, although is technically nonspecific. Electronically Signed   By: Gaylyn Rong M.D.   On: 02/18/2021 19:20   DG Abdomen Acute W/Chest  Result Date: 02/18/2021 CLINICAL DATA:  cough, emesis, epigastric pain EXAM: DG ABDOMEN ACUTE WITH 1 VIEW CHEST COMPARISON:  None. FINDINGS: There is no evidence of dilated bowel loops  or free intraperitoneal air. There is scattered radiopaque material of the ascending and transverse colon. Heart size and mediastinal contours are within normal limits. Both lungs are clear. No acute osseous abnormality. IMPRESSION: Negative abdominal radiographs.  No acute cardiopulmonary disease. Electronically Signed   By: Caprice Renshaw   On: 02/18/2021 16:42    Procedures Procedures   Medications Ordered in ED Medications  insulin aspart (novoLOG) injection 8 Units (has no administration in time range)  lactated ringers bolus 2,000 mL (0 mLs Intravenous Stopped 02/18/21 1826)  metoCLOPramide (REGLAN) injection 10 mg (10 mg Intravenous Given 02/18/21 1655)  morphine 4 MG/ML injection 4 mg (4 mg Intravenous Given 02/18/21 1654)  pantoprazole (PROTONIX) injection 40 mg (40 mg Intravenous Given 02/18/21 1651)  iohexol (OMNIPAQUE) 300 MG/ML solution 100 mL (100 mLs Intravenous Contrast Given 02/18/21 1846)  prochlorperazine (COMPAZINE) injection 10 mg (10 mg Intravenous Given 02/18/21 1914)  diphenhydrAMINE (BENADRYL) injection 12.5 mg (12.5 mg Intravenous Given 02/18/21 1911)    ED Course  I have reviewed the triage vital signs  and the nursing notes.  Pertinent labs & imaging results that were available during my care of the patient were reviewed by me and considered in my medical decision making (see chart for details).  Clinical Course as of 02/18/21 2139  Thu Feb 18, 2021  1610 Patient is having persistent vomiting will give additional dose of Compazine [JK]  1939 CT scan does not show any acute findings [JK]  2111 Beta-hydroxybutyric acid(!) [JK]  2111 Beta hydroxybutyrate is significantly elevated. [JK]  2112 Patient does remain tachycardic with a rate of 132 [JK]  2121 Rate in the 90s at the bedside.  Patient has tolerated p.o. fluids. [JK]    Clinical Course User Index [JK] Linwood Dibbles, MD   MDM Rules/Calculators/A&P                          Patient presented to the ED for evaluation of recurrent nausea and vomiting as well as new onset diabetes.  Patient was recently seen in the ED for similar symptoms.  Felt to not be in DKA and discharged home.  Patient return with recurrent symptoms.  Patient's laboratory tests do show signs of anion gap metabolic acidosis.  He does have elevation of his beta hydroxybutyrate.  pH however is not elevated in fact he is alkalotic.  Patient has improved with treatment.  Somewhat atypical presentation for DKA with his normal pH however with the recurrent nature of his symptoms his anion gap acidosis I will consult with the medical service for admission and further treatment.  Patient has been given a dose of insulin. Final Clinical Impression(s) / ED Diagnoses Final diagnoses:  Ketoacidosis  New onset type 1 diabetes mellitus, uncontrolled (HCC)     Linwood Dibbles, MD 02/18/21 2139

## 2021-02-18 NOTE — ED Triage Notes (Signed)
Patient here for evaluation of emesis after being seen this morning for same. Patient states he given zofran this morning without relief and was he received a second antiemetic that helped him stop vomiting but was prescribed zofran for home. Patient states vomiting has resumed. Patient alert, oriented, and in no apparent distress at this time.

## 2021-02-19 ENCOUNTER — Other Ambulatory Visit: Payer: Self-pay

## 2021-02-19 ENCOUNTER — Other Ambulatory Visit (HOSPITAL_COMMUNITY): Payer: Self-pay

## 2021-02-19 DIAGNOSIS — E1065 Type 1 diabetes mellitus with hyperglycemia: Principal | ICD-10-CM

## 2021-02-19 LAB — CBC
HCT: 41.4 % (ref 39.0–52.0)
Hemoglobin: 14.8 g/dL (ref 13.0–17.0)
MCH: 29.8 pg (ref 26.0–34.0)
MCHC: 35.7 g/dL (ref 30.0–36.0)
MCV: 83.5 fL (ref 80.0–100.0)
Platelets: 215 10*3/uL (ref 150–400)
RBC: 4.96 MIL/uL (ref 4.22–5.81)
RDW: 11.8 % (ref 11.5–15.5)
WBC: 13.1 10*3/uL — ABNORMAL HIGH (ref 4.0–10.5)
nRBC: 0 % (ref 0.0–0.2)

## 2021-02-19 LAB — BASIC METABOLIC PANEL
Anion gap: 8 (ref 5–15)
BUN: 9 mg/dL (ref 6–20)
CO2: 24 mmol/L (ref 22–32)
Calcium: 9.2 mg/dL (ref 8.9–10.3)
Chloride: 106 mmol/L (ref 98–111)
Creatinine, Ser: 0.85 mg/dL (ref 0.61–1.24)
GFR, Estimated: >60 mL/min (ref >60)
Glucose, Bld: 143 mg/dL — ABNORMAL HIGH (ref 70–99)
Potassium: 3.5 mmol/L (ref 3.5–5.1)
Sodium: 138 mmol/L (ref 135–145)

## 2021-02-19 LAB — URINALYSIS, ROUTINE W REFLEX MICROSCOPIC
Bacteria, UA: NONE SEEN
Bilirubin Urine: NEGATIVE
Glucose, UA: >=500 mg/dL — AB
Hgb urine dipstick: NEGATIVE
Ketones, ur: 80 mg/dL — AB
Leukocytes,Ua: NEGATIVE
Nitrite: NEGATIVE
Protein, ur: NEGATIVE mg/dL
Specific Gravity, Urine: 1.035 — ABNORMAL HIGH (ref 1.005–1.030)
pH: 6 (ref 5.0–8.0)

## 2021-02-19 LAB — GLUCOSE, CAPILLARY
Glucose-Capillary: 118 mg/dL — ABNORMAL HIGH (ref 70–99)
Glucose-Capillary: 192 mg/dL — ABNORMAL HIGH (ref 70–99)
Glucose-Capillary: 204 mg/dL — ABNORMAL HIGH (ref 70–99)
Glucose-Capillary: 254 mg/dL — ABNORMAL HIGH (ref 70–99)

## 2021-02-19 LAB — RAPID URINE DRUG SCREEN, HOSP PERFORMED
Amphetamines: NOT DETECTED
Barbiturates: NOT DETECTED
Benzodiazepines: NOT DETECTED
Cocaine: NOT DETECTED
Opiates: POSITIVE — AB
Tetrahydrocannabinol: POSITIVE — AB

## 2021-02-19 LAB — HIV ANTIBODY (ROUTINE TESTING W REFLEX): HIV Screen 4th Generation wRfx: NONREACTIVE

## 2021-02-19 LAB — MAGNESIUM: Magnesium: 1.8 mg/dL (ref 1.7–2.4)

## 2021-02-19 MED ORDER — FAMOTIDINE 20 MG PO TABS
20.0000 mg | ORAL_TABLET | Freq: Every day | ORAL | 0 refills | Status: AC
  Filled 2021-02-19: qty 30, 30d supply, fill #0

## 2021-02-19 MED ORDER — ACCU-CHEK GUIDE VI STRP
ORAL_STRIP | 12 refills | Status: AC
  Filled 2021-02-19: qty 100, 30d supply, fill #0

## 2021-02-19 MED ORDER — ACCU-CHEK GUIDE W/DEVICE KIT
PACK | 0 refills | Status: AC
  Filled 2021-02-19: qty 1, 1d supply, fill #0

## 2021-02-19 MED ORDER — LACTATED RINGERS IV BOLUS
500.0000 mL | Freq: Once | INTRAVENOUS | Status: AC
  Administered 2021-02-19: 500 mL via INTRAVENOUS

## 2021-02-19 MED ORDER — ENOXAPARIN SODIUM 40 MG/0.4ML IJ SOSY: 40.0000 mg | PREFILLED_SYRINGE | Freq: Every day | INTRAMUSCULAR | Status: DC

## 2021-02-19 MED ORDER — METFORMIN HCL 500 MG PO TABS
500.0000 mg | ORAL_TABLET | Freq: Two times a day (BID) | ORAL | 0 refills | Status: AC
  Filled 2021-02-19: qty 60, 30d supply, fill #0

## 2021-02-19 MED ORDER — LIVING WELL WITH DIABETES BOOK
Freq: Once | Status: AC
  Filled 2021-02-19: qty 1

## 2021-02-19 MED ORDER — ACCU-CHEK SOFTCLIX LANCETS MISC
5 refills | Status: AC
  Filled 2021-02-19: qty 100, 30d supply, fill #0

## 2021-02-19 MED ORDER — ONDANSETRON 4 MG PO TBDP
4.0000 mg | ORAL_TABLET | Freq: Three times a day (TID) | ORAL | 0 refills | Status: AC | PRN
  Filled 2021-02-19: qty 20, 7d supply, fill #0

## 2021-02-19 NOTE — Progress Notes (Signed)
Vomiting has completely resolved-he feels much better-he is tolerated oral intake without any issues-and is anxious to go home.  He appears to have new onset diabetes-he would prefer to go home on oral hypoglycemic agents-he is not keen on going home on insulin right away.  Unfortunately-A1c is still pending.  He claims that he will follow with his outpatient doctor-follow-up on A1c-and decide on further treatment modifications including insulin accordingly.  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  I have counseled him extensively regarding the importance of complete cessation to see if his symptoms improve.  He understands-and claims that he will stop smoking for at least a month.  See discharge summary for further details.

## 2021-02-19 NOTE — Discharge Summary (Signed)
PATIENT DETAILS Name: Scott Barton Age: 30 y.o. Sex: male Date of Birth: [birth year] MRN: [id number]. Admitting Physician: Lenore Cordia, MD DPO:[id number], No Pcp Per (Inactive)  Admit Date: 02/18/2021 Discharge date: 02/19/2021  Recommendations for Outpatient Follow-up:  Follow up with PCP in 1-2 weeks Please obtain CMP/CBC in one week Please follow A1c Continue counseling regarding importance of complete abstinence from marijuana-has unexplained vomiting.  Admitted From:  Home  Disposition: [place]: No  Equipment/Devices: None  Discharge Condition: Stable  CODE STATUS: FULL CODE  Diet recommendation:  Diet Order             DIET SOFT Room service appropriate? Yes; Fluid consistency: Thin  Diet effective now           Diet Carb Modified                    Brief Summary: See H&P, Labs, Consult and Test reports for all details in brief, patient is a 30 year old male with history of daily marijuana use-who presented to the ED with vomiting (second visit).  He was found to have probably new onset diabetes-and subsequently admitted to the hospitalist service.  Brief Hospital Course: Nausea/vomiting/abdominal pain: Has resolved with just supportive care-CT abdomen negative for any major abnormalities.  Lipase was within normal limits.  Abdominal exam was benign.  He was treated with supportive care-diet was advanced-he feels much better and is requesting discharge home.  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  He has been counseled extensively regarding the importance of complete cessation of marijuana to see if his vomiting spells improved.  He will be discharged on as needed antiemetics-he has been counseled regarding the importance of small portion meals.  DM-2: New onset-A1c pending-patient not keen on starting insulin right away-suspect that he may not in fact have had DKA when he first  presented-unclear picture given intractable vomiting-possible starvation ketosis-causing mildly reduced bicarb levels.  Furthermore-he was alkalotic-likely reflecting contraction alkalosis.  In any event-he is to not keen on going home on insulin-he we will have his outpatient MD follow-up on A1c and adjust therapies accordingly.  For now I will just put him on oral metformin-with further optimization to be done by his primary care practitioner.  Marijuana use: Counseled extensively-see above   Procedures None  Discharge Diagnoses:  Principal Problem:   Nausea & vomiting Active Problems:   Hyperglycemia due to diabetes mellitus [facility])   Discharge Instructions:  Activity:  As tolerated   Discharge Instructions     Diet Carb Modified   Complete by: As directed    Discharge instructions   Complete by: As directed    Follow with Primary MD in 1-2 weeks  Check your blood glucose multiple times a day-keep a record of these readings and take it with you to your next appointment with your PCP.  Eat small portion meals  Stop all marijuana use  Please get a complete blood count and chemistry panel checked by your Primary MD at your next visit, and again as instructed by your Primary MD.  Get Medicines reviewed and adjusted: Please take all your medications with you for your next visit with your Primary MD  Laboratory/radiological data: Please request your Primary MD to go over all hospital tests and procedure/radiological results at the follow up, please ask your Primary MD to get all Hospital records sent to his/her office.  In some cases, they will be blood work,  cultures and biopsy results pending at the time of your discharge. Please request that your primary care M.D. follows up on these results.  Also Note the following: If you experience worsening of your admission symptoms, develop shortness of breath, life threatening emergency, suicidal or homicidal thoughts you must seek  medical attention immediately by calling 911 or calling your MD immediately  if symptoms less severe.  You must read complete instructions/literature along with all the possible adverse reactions/side effects for all the Medicines you take and that have been prescribed to you. Take any new Medicines after you have completely understood and accpet all the possible adverse reactions/side effects.   Do not drive when taking Pain medications or sleeping medications (Benzodaizepines)  Do not take more than prescribed Pain, Sleep and Anxiety Medications. It is not advisable to combine anxiety,sleep and pain medications without talking with your primary care practitioner  Special Instructions: If you have smoked or chewed Tobacco  in the last 2 yrs please stop smoking, stop any regular Alcohol  and or any Recreational drug use.  Wear Seat belts while driving.  Please note: You were cared for by a hospitalist during your hospital stay. Once you are discharged, your primary care physician will handle any further medical issues. Please note that NO REFILLS for any discharge medications will be authorized once you are discharged, as it is imperative that you return to your primary care physician (or establish a relationship with a primary care physician if you do not have one) for your post hospital discharge needs so that they can reassess your need for medications and monitor your lab values.   Increase activity slowly   Complete by: As directed       Allergies as of 02/19/2021   No Known Allergies      Medication List     TAKE these medications    Accu-Chek Guide w/Device Kit use as directed   famotidine 20 MG tablet Commonly known as: Pepcid Take 1 tablet (20 mg total) by mouth daily.   metFORMIN 500 MG tablet Commonly known as: GLUCOPHAGE Take 1 tablet (500 mg total) by mouth 2 (two) times daily with a meal.   ondansetron 4 MG disintegrating tablet Commonly known as: Zofran  ODT Take 1 tablet (4 mg total) by mouth every 8 (eight) hours as needed for nausea or vomiting.        Follow-up Information     [facility] Follow up on 03/24/2021.   Specialty: Family Medicine Why: for hospital follow up appointment at 1:30 with Juluis Mire. If you cannot make this scheduled appointment please call the office to reschedule. Contact information: [place] [address] [phone]               No Known Allergies   Consultations:  None    Other Procedures/Studies: CT ABDOMEN PELVIS W CONTRAST  Result Date: 02/18/2021 CLINICAL DATA:  Acute abdominal pain EXAM: CT ABDOMEN AND PELVIS WITH CONTRAST TECHNIQUE: Multidetector CT imaging of the abdomen and pelvis was performed using the standard protocol following bolus administration of intravenous contrast. CONTRAST:  133m OMNIPAQUE IOHEXOL 300 MG/ML  SOLN COMPARISON:  Radiograph 02/18/2021 FINDINGS: Lower chest: No significant abnormality. Hepatobiliary: Hypodensity in the left hepatic lobe along the falciform ligament persists on delayed phase images and is probably from focal steatosis given the location, although is technically nonspecific. Gallbladder unremarkable. Pancreas: Unremarkable Spleen: Unremarkable Adrenals/Urinary Tract: Unremarkable Stomach/Bowel: Unremarkable.  Appendix appears normal.  Vascular/Lymphatic: Unremarkable Reproductive: Unremarkable Other: No supplemental non-categorized findings. Musculoskeletal: Degenerative disc disease at L5-S1, advanced for age. Congenitally short pedicles in the lumbar spine along with degenerative disc disease resulting in impingement bilaterally at L5-S1 and potentially also at L3-4 and L4-5. Schmorl's node along the superior endplate of S1. Mildly transitional S1 vertebra. IMPRESSION: 1. A specific cause for the patient's abdominal pain is not identified. 2. Congenitally short pedicles in the lumbar spine  with degenerative disc disease resulting in impingement at L5-S1 and potentially also at L3-4 and L4-5. 3. Hypodensity in the left hepatic lobe along the falciform ligament probably represents focal steatosis given this location, although is technically nonspecific. Electronically Signed   By: Van Clines M.D.   On: 02/18/2021 19:20   DG Abdomen Acute W/Chest  Result Date: 02/18/2021 CLINICAL DATA:  cough, emesis, epigastric pain EXAM: DG ABDOMEN ACUTE WITH 1 VIEW CHEST COMPARISON:  None. FINDINGS: There is no evidence of dilated bowel loops or free intraperitoneal air. There is scattered radiopaque material of the ascending and transverse colon. Heart size and mediastinal contours are within normal limits. Both lungs are clear. No acute osseous abnormality. IMPRESSION: Negative abdominal radiographs.  No acute cardiopulmonary disease. Electronically Signed   By: Maurine Simmering   On: 02/18/2021 16:42     TODAY-DAY OF DISCHARGE:  Subjective:   Scott Barton today has no headache,no chest abdominal pain,no new weakness tingling or numbness, feels much better wants to go home today.  Objective:   Blood pressure (!) 152/91, pulse 95, temperature 98.8 F (37.1 C), temperature source Oral, resp. rate 16, height '6\' 3"'  (1.905 m), weight 86.9 kg, SpO2 98 %.  Intake/Output Summary (Last 24 hours) at 02/19/2021 1444 Last data filed at 02/19/2021 0653 Gross per 24 hour  Intake 818.47 ml  Output 300 ml  Net 518.47 ml   Filed Weights   02/19/21 0017  Weight: 86.9 kg    Exam: Awake Alert, Oriented *3, No new F.N deficits, Normal affect [place].AT,PERRAL Supple Neck,No JVD, No cervical lymphadenopathy appriciated.  Symmetrical Chest wall movement, Good air movement bilaterally, CTAB RRR,No Gallops,Rubs or new Murmurs, No Parasternal Heave +ve B.Sounds, Abd Soft, Non tender, No organomegaly appriciated, No rebound -guarding or rigidity. No Cyanosis, Clubbing or edema, No new Rash or  bruise   PERTINENT RADIOLOGIC STUDIES: CT ABDOMEN PELVIS W CONTRAST  Result Date: 02/18/2021 CLINICAL DATA:  Acute abdominal pain EXAM: CT ABDOMEN AND PELVIS WITH CONTRAST TECHNIQUE: Multidetector CT imaging of the abdomen and pelvis was performed using the standard protocol following bolus administration of intravenous contrast. CONTRAST:  144m OMNIPAQUE IOHEXOL 300 MG/ML  SOLN COMPARISON:  Radiograph 02/18/2021 FINDINGS: Lower chest: No significant abnormality. Hepatobiliary: Hypodensity in the left hepatic lobe along the falciform ligament persists on delayed phase images and is probably from focal steatosis given the location, although is technically nonspecific. Gallbladder unremarkable. Pancreas: Unremarkable Spleen: Unremarkable Adrenals/Urinary Tract: Unremarkable Stomach/Bowel: Unremarkable.  Appendix appears normal. Vascular/Lymphatic: Unremarkable Reproductive: Unremarkable Other: No supplemental non-categorized findings. Musculoskeletal: Degenerative disc disease at L5-S1, advanced for age. Congenitally short pedicles in the lumbar spine along with degenerative disc disease resulting in impingement bilaterally at L5-S1 and potentially also at L3-4 and L4-5. Schmorl's node along the superior endplate of S1. Mildly transitional S1 vertebra. IMPRESSION: 1. A specific cause for the patient's abdominal pain is not identified. 2. Congenitally short pedicles in the lumbar spine with degenerative disc disease resulting in impingement at L5-S1 and potentially also at L3-4 and L4-5. 3. Hypodensity in the  left hepatic lobe along the falciform ligament probably represents focal steatosis given this location, although is technically nonspecific. Electronically Signed   By: Van Clines M.D.   On: 02/18/2021 19:20   DG Abdomen Acute W/Chest  Result Date: 02/18/2021 CLINICAL DATA:  cough, emesis, epigastric pain EXAM: DG ABDOMEN ACUTE WITH 1 VIEW CHEST COMPARISON:  None. FINDINGS: There is no evidence of  dilated bowel loops or free intraperitoneal air. There is scattered radiopaque material of the ascending and transverse colon. Heart size and mediastinal contours are within normal limits. Both lungs are clear. No acute osseous abnormality. IMPRESSION: Negative abdominal radiographs.  No acute cardiopulmonary disease. Electronically Signed   By: Maurine Simmering   On: 02/18/2021 16:42     PERTINENT LAB RESULTS: CBC: Recent Labs    02/18/21 1420 02/18/21 1433 02/19/21 0343  WBC 16.2*  --  13.1*  HGB 16.9 16.3 14.8  HCT 47.6 48.0 41.4  PLT 281  --  215   CMET CMP     Component Value Date/Time   NA 138 02/19/2021 0343   K 3.5 02/19/2021 0343   CL 106 02/19/2021 0343   CO2 24 02/19/2021 0343   GLUCOSE 143 (H) 02/19/2021 0343   BUN 9 02/19/2021 0343   CREATININE 0.85 02/19/2021 0343   CALCIUM 9.2 02/19/2021 0343   PROT 7.6 02/18/2021 1420   ALBUMIN 4.3 02/18/2021 1420   AST 16 02/18/2021 1420   ALT 15 02/18/2021 1420   ALKPHOS 65 02/18/2021 1420   BILITOT 1.5 (H) 02/18/2021 1420   GFRNONAA >60 02/19/2021 0343    GFR Estimated Creatinine Clearance: 154.6 mL/min (by C-G formula based on SCr of 0.85 mg/dL). Recent Labs    02/17/21 1924 02/18/21 1420  LIPASE 20 20   No results for input(s): CKTOTAL, CKMB, CKMBINDEX, TROPONINI in the last 72 hours. Invalid input(s): POCBNP No results for input(s): DDIMER in the last 72 hours. No results for input(s): HGBA1C in the last 72 hours. No results for input(s): CHOL, HDL, LDLCALC, TRIG, CHOLHDL, LDLDIRECT in the last 72 hours. No results for input(s): TSH, T4TOTAL, T3FREE, THYROIDAB in the last 72 hours.  Invalid input(s): FREET3 No results for input(s): VITAMINB12, FOLATE, FERRITIN, TIBC, IRON, RETICCTPCT in the last 72 hours. Coags: No results for input(s): INR in the last 72 hours.  Invalid input(s): PT Microbiology: Recent Results (from the past 240 hour(s))  Resp Panel by RT-PCR (Flu A&B, Covid) Nasopharyngeal Swab      Status: None   Collection Time: 02/18/21  9:40 PM   Specimen: Nasopharyngeal Swab; Nasopharyngeal(NP) swabs in vial transport medium  Result Value Ref Range Status   SARS Coronavirus 2 by RT PCR NEGATIVE NEGATIVE Final    Comment: (NOTE) SARS-CoV-2 target nucleic acids are NOT DETECTED.  The SARS-CoV-2 RNA is generally detectable in upper respiratory specimens during the acute phase of infection. The lowest concentration of SARS-CoV-2 viral copies this assay can detect is 138 copies/mL. A negative result does not preclude SARS-Cov-2 infection and should not be used as the sole basis for treatment or other patient management decisions. A negative result may occur with  improper specimen collection/handling, submission of specimen other than nasopharyngeal swab, presence of viral mutation(s) within the areas targeted by this assay, and inadequate number of viral copies(<138 copies/mL). A negative result must be combined with clinical observations, patient history, and epidemiological information. The expected result is Negative.  Fact Sheet for Patients:  EntrepreneurPulse.com.au  Fact Sheet for Healthcare Providers:  IncredibleEmployment.be  This  test is no t yet approved or cleared by the [place] and  has been authorized for detection and/or diagnosis of SARS-CoV-2 by FDA under an Emergency Use Authorization (EUA). This EUA will remain  in effect (meaning this test can be used) for the duration of the COVID-19 declaration under Section 564(b)(1) of the Act, 21 U.S.C.section 360bbb-3(b)(1), unless the authorization is terminated  or revoked sooner.       Influenza A by PCR NEGATIVE NEGATIVE Final   Influenza B by PCR NEGATIVE NEGATIVE Final    Comment: (NOTE) The Xpert Xpress SARS-CoV-2/FLU/RSV plus assay is intended as an aid in the diagnosis of influenza from Nasopharyngeal swab specimens and should not be used as a sole basis  for treatment. Nasal washings and aspirates are unacceptable for Xpert Xpress SARS-CoV-2/FLU/RSV testing.  Fact Sheet for Patients: EntrepreneurPulse.com.au  Fact Sheet for Healthcare Providers: IncredibleEmployment.be  This test is not yet approved or cleared by the [place] FDA and has been authorized for detection and/or diagnosis of SARS-CoV-2 by FDA under an Emergency Use Authorization (EUA). This EUA will remain in effect (meaning this test can be used) for the duration of the COVID-19 declaration under Section 564(b)(1) of the Act, 21 U.S.C. section 360bbb-3(b)(1), unless the authorization is terminated or revoked.  Performed at [facility], [place] [address] [place], [place] [address]     FURTHER DISCHARGE INSTRUCTIONS:  Get Medicines reviewed and adjusted: Please take all your medications with you for your next visit with your Primary MD  Laboratory/radiological data: Please request your Primary MD to go over all hospital tests and procedure/radiological results at the follow up, please ask your Primary MD to get all Hospital records sent to his/her office.  In some cases, they will be blood work, cultures and biopsy results pending at the time of your discharge. Please request that your primary care M.D. goes through all the records of your hospital data and follows up on these results.  Also Note the following: If you experience worsening of your admission symptoms, develop shortness of breath, life threatening emergency, suicidal or homicidal thoughts you must seek medical attention immediately by calling 911 or calling your MD immediately  if symptoms less severe.  You must read complete instructions/literature along with all the possible adverse reactions/side effects for all the Medicines you take and that have been prescribed to you. Take any new Medicines after you have completely understood and accpet all the possible  adverse reactions/side effects.   Do not drive when taking Pain medications or sleeping medications (Benzodaizepines)  Do not take more than prescribed Pain, Sleep and Anxiety Medications. It is not advisable to combine anxiety,sleep and pain medications without talking with your primary care practitioner  Special Instructions: If you have smoked or chewed Tobacco  in the last 2 yrs please stop smoking, stop any regular Alcohol  and or any Recreational drug use.  Wear Seat belts while driving.  Please note: You were cared for by a hospitalist during your hospital stay. Once you are discharged, your primary care physician will handle any further medical issues. Please note that NO REFILLS for any discharge medications will be authorized once you are discharged, as it is imperative that you return to your primary care physician (or establish a relationship with a primary care physician if you do not have one) for your post hospital discharge needs so that they can reassess your need for medications and monitor your lab values.  Total Time  spent coordinating discharge including counseling, education and face to face time equals 30 minutes.  SignedOren Binet 02/19/2021 2:44 PM

## 2021-02-19 NOTE — Progress Notes (Signed)
Scott Barton to be D/C'd Home per MD order.  Discussed with the patient and all questions fully answered.  VSS, Skin clean, dry and intact without evidence of skin break down, no evidence of skin tears noted. IV catheter discontinued intact. Site without signs and symptoms of complications. Dressing and pressure applied.  An After Visit Summary was printed and given to the patient. Patient received prescription.  D/c education completed with patient/family including follow up instructions, medication list, d/c activities limitations if indicated, with other d/c instructions as indicated by MD - patient able to verbalize understanding, all questions fully answered.   Patient instructed to return to ED, call 911, or call MD for any changes in condition.   Patient escorted via WC, and D/C home via private auto.  Selena Batten Lynsie Mcwatters 02/19/2021 6:19 PM

## 2021-02-19 NOTE — Care Management (Signed)
1334 02-19-21 Case Manager received consult for PCP. [facility] stated they were not taking new patients to call [facility]. Case Manager called [facility] due to patient being in the zip code area and the earliest appointment is 03-24-21. Appointment placed on the AVS. No further needs identified.

## 2021-02-20 LAB — HEMOGLOBIN A1C
Hgb A1c MFr Bld: 12.6 % — ABNORMAL HIGH (ref 4.8–5.6)
Mean Plasma Glucose: 315 mg/dL

## 2021-02-21 ENCOUNTER — Encounter (HOSPITAL_COMMUNITY): Payer: Self-pay

## 2021-02-21 ENCOUNTER — Emergency Department (HOSPITAL_COMMUNITY)
Admission: EM | Admit: 2021-02-21 | Discharge: 2021-02-21 | Disposition: A | Discharge status: Home / Self Care | Payer: Self-pay | Attending: Emergency Medicine | Admitting: Emergency Medicine

## 2021-02-21 ENCOUNTER — Other Ambulatory Visit: Payer: Self-pay

## 2021-02-21 ENCOUNTER — Emergency Department (HOSPITAL_COMMUNITY): Payer: Self-pay

## 2021-02-21 DIAGNOSIS — Z5321 Procedure and treatment not carried out due to patient leaving prior to being seen by health care provider: Secondary | ICD-10-CM | POA: Insufficient documentation

## 2021-02-21 DIAGNOSIS — R112 Nausea with vomiting, unspecified: Secondary | ICD-10-CM | POA: Insufficient documentation

## 2021-02-21 LAB — URINALYSIS, ROUTINE W REFLEX MICROSCOPIC
Bacteria, UA: NONE SEEN
Bilirubin Urine: NEGATIVE
Glucose, UA: >=500 mg/dL — AB
Hgb urine dipstick: NEGATIVE
Ketones, ur: 80 mg/dL — AB
Leukocytes,Ua: NEGATIVE
Nitrite: NEGATIVE
Protein, ur: NEGATIVE mg/dL
Specific Gravity, Urine: 1.008 (ref 1.005–1.030)
pH: 8 (ref 5.0–8.0)

## 2021-02-21 LAB — BASIC METABOLIC PANEL
Anion gap: 20 — ABNORMAL HIGH (ref 5–15)
BUN: 8 mg/dL (ref 6–20)
CO2: 18 mmol/L — ABNORMAL LOW (ref 22–32)
Calcium: 9.6 mg/dL (ref 8.9–10.3)
Chloride: 99 mmol/L (ref 98–111)
Creatinine, Ser: 1.09 mg/dL (ref 0.61–1.24)
GFR, Estimated: >60 mL/min (ref >60)
Glucose, Bld: 259 mg/dL — ABNORMAL HIGH (ref 70–99)
Potassium: 3.7 mmol/L (ref 3.5–5.1)
Sodium: 137 mmol/L (ref 135–145)

## 2021-02-21 LAB — CBC
HCT: 48.4 % (ref 39.0–52.0)
Hemoglobin: 17.4 g/dL — ABNORMAL HIGH (ref 13.0–17.0)
MCH: 29.4 pg (ref 26.0–34.0)
MCHC: 36 g/dL (ref 30.0–36.0)
MCV: 81.9 fL (ref 80.0–100.0)
Platelets: 303 10*3/uL (ref 150–400)
RBC: 5.91 MIL/uL — ABNORMAL HIGH (ref 4.22–5.81)
RDW: 11.4 % — ABNORMAL LOW (ref 11.5–15.5)
WBC: 14.1 10*3/uL — ABNORMAL HIGH (ref 4.0–10.5)
nRBC: 0 % (ref 0.0–0.2)

## 2021-02-21 LAB — CBG MONITORING, ED: Glucose-Capillary: 277 mg/dL — ABNORMAL HIGH (ref 70–99)

## 2021-02-21 LAB — TROPONIN I (HIGH SENSITIVITY): Troponin I (High Sensitivity): 6 ng/L (ref <18)

## 2021-02-21 NOTE — ED Triage Notes (Signed)
Pt reports ongoing nausea and vomiting since x2 weeks. Pt reports recently seen here and admitted to hospital for new onset diabetes. Pt was dc'd Friday afternoon with a new RX of Metformin. Pt states he has taken 1 Metformin tablet since being dc'd.  Pt endorses mid-sternum cp pain when belching. Pt reports zofran made him sick while he was in the hospital.   CBG 227

## 2021-02-21 NOTE — ED Notes (Signed)
Pt stated that he knew he was going to have to wait a while so he would follow up with his PCP tomorrow. Moving pt OTF.

## 2021-03-24 ENCOUNTER — Telehealth (INDEPENDENT_AMBULATORY_CARE_PROVIDER_SITE_OTHER): Payer: Self-pay | Admitting: Primary Care

## 2021-04-27 ENCOUNTER — Ambulatory Visit (INDEPENDENT_AMBULATORY_CARE_PROVIDER_SITE_OTHER): Payer: Self-pay | Admitting: Primary Care

## 2022-06-09 ENCOUNTER — Emergency Department (HOSPITAL_COMMUNITY)
Admission: EM | Admit: 2022-06-09 | Discharge: 2022-06-09 | Disposition: A | Discharge status: Home / Self Care | Payer: Self-pay | Attending: Emergency Medicine | Admitting: Emergency Medicine

## 2022-06-09 ENCOUNTER — Encounter (HOSPITAL_COMMUNITY): Payer: Self-pay

## 2022-06-09 ENCOUNTER — Emergency Department (HOSPITAL_COMMUNITY): Payer: Self-pay

## 2022-06-09 ENCOUNTER — Ambulatory Visit
Admission: EM | Admit: 2022-06-09 | Discharge: 2022-06-09 | Disposition: A | Discharge status: Home / Self Care | Payer: Self-pay | Attending: Internal Medicine | Admitting: Internal Medicine

## 2022-06-09 ENCOUNTER — Other Ambulatory Visit: Payer: Self-pay

## 2022-06-09 DIAGNOSIS — R197 Diarrhea, unspecified: Secondary | ICD-10-CM | POA: Insufficient documentation

## 2022-06-09 DIAGNOSIS — R112 Nausea with vomiting, unspecified: Secondary | ICD-10-CM

## 2022-06-09 DIAGNOSIS — E1165 Type 2 diabetes mellitus with hyperglycemia: Secondary | ICD-10-CM | POA: Insufficient documentation

## 2022-06-09 DIAGNOSIS — R1084 Generalized abdominal pain: Secondary | ICD-10-CM

## 2022-06-09 DIAGNOSIS — R739 Hyperglycemia, unspecified: Secondary | ICD-10-CM

## 2022-06-09 DIAGNOSIS — K920 Hematemesis: Secondary | ICD-10-CM

## 2022-06-09 DIAGNOSIS — R109 Unspecified abdominal pain: Secondary | ICD-10-CM

## 2022-06-09 DIAGNOSIS — Z7984 Long term (current) use of oral hypoglycemic drugs: Secondary | ICD-10-CM | POA: Insufficient documentation

## 2022-06-09 LAB — URINALYSIS, ROUTINE W REFLEX MICROSCOPIC
Bilirubin Urine: NEGATIVE
Glucose, UA: NEGATIVE mg/dL
Hgb urine dipstick: NEGATIVE
Ketones, ur: 40 mg/dL — AB
Leukocytes,Ua: NEGATIVE
Nitrite: NEGATIVE
Protein, ur: NEGATIVE mg/dL
Specific Gravity, Urine: 1.01 (ref 1.005–1.030)
pH: 6.5 (ref 5.0–8.0)

## 2022-06-09 LAB — I-STAT VENOUS BLOOD GAS, ED
Acid-base deficit: 1 mmol/L (ref 0.0–2.0)
Bicarbonate: 19.9 mmol/L — ABNORMAL LOW (ref 20.0–28.0)
Calcium, Ion: 1.09 mmol/L — ABNORMAL LOW (ref 1.15–1.40)
HCT: 48 % (ref 39.0–52.0)
Hemoglobin: 16.3 g/dL (ref 13.0–17.0)
O2 Saturation: 66 %
Potassium: 3.2 mmol/L — ABNORMAL LOW (ref 3.5–5.1)
Sodium: 140 mmol/L (ref 135–145)
TCO2: 21 mmol/L — ABNORMAL LOW (ref 22–32)
pCO2, Ven: 24.5 mmHg — ABNORMAL LOW (ref 44–60)
pH, Ven: 7.518 — ABNORMAL HIGH (ref 7.25–7.43)
pO2, Ven: 30 mmHg — CL (ref 32–45)

## 2022-06-09 LAB — COMPREHENSIVE METABOLIC PANEL
ALT: 21 U/L (ref 0–44)
AST: 23 U/L (ref 15–41)
Albumin: 4.6 g/dL (ref 3.5–5.0)
Alkaline Phosphatase: 64 U/L (ref 38–126)
Anion gap: 16 — ABNORMAL HIGH (ref 5–15)
BUN: 11 mg/dL (ref 6–20)
CO2: 19 mmol/L — ABNORMAL LOW (ref 22–32)
Calcium: 9.7 mg/dL (ref 8.9–10.3)
Chloride: 105 mmol/L (ref 98–111)
Creatinine, Ser: 1.22 mg/dL (ref 0.61–1.24)
GFR, Estimated: >60 mL/min (ref >60)
Glucose, Bld: 265 mg/dL — ABNORMAL HIGH (ref 70–99)
Potassium: 3.2 mmol/L — ABNORMAL LOW (ref 3.5–5.1)
Sodium: 140 mmol/L (ref 135–145)
Total Bilirubin: 1.7 mg/dL — ABNORMAL HIGH (ref 0.3–1.2)
Total Protein: 8 g/dL (ref 6.5–8.1)

## 2022-06-09 LAB — LIPASE, BLOOD: Lipase: 19 U/L (ref 11–51)

## 2022-06-09 LAB — CBC WITH DIFFERENTIAL/PLATELET
Abs Immature Granulocytes: 0.06 10*3/uL (ref 0.00–0.07)
Basophils Absolute: 0 10*3/uL (ref 0.0–0.1)
Basophils Relative: 0 %
Eosinophils Absolute: 0 10*3/uL (ref 0.0–0.5)
Eosinophils Relative: 0 %
HCT: 48 % (ref 39.0–52.0)
Hemoglobin: 17.2 g/dL — ABNORMAL HIGH (ref 13.0–17.0)
Immature Granulocytes: 1 %
Lymphocytes Relative: 12 %
Lymphs Abs: 1.3 10*3/uL (ref 0.7–4.0)
MCH: 30 pg (ref 26.0–34.0)
MCHC: 35.8 g/dL (ref 30.0–36.0)
MCV: 83.6 fL (ref 80.0–100.0)
Monocytes Absolute: 0.7 10*3/uL (ref 0.1–1.0)
Monocytes Relative: 7 %
Neutro Abs: 8.8 10*3/uL — ABNORMAL HIGH (ref 1.7–7.7)
Neutrophils Relative %: 80 %
Platelets: 290 10*3/uL (ref 150–400)
RBC: 5.74 MIL/uL (ref 4.22–5.81)
RDW: 12.2 % (ref 11.5–15.5)
WBC: 11 10*3/uL — ABNORMAL HIGH (ref 4.0–10.5)
nRBC: 0 % (ref 0.0–0.2)

## 2022-06-09 LAB — CBG MONITORING, ED
Glucose-Capillary: 251 mg/dL — ABNORMAL HIGH (ref 70–99)
Glucose-Capillary: 245 mg/dL — ABNORMAL HIGH (ref 70–99)

## 2022-06-09 LAB — BETA-HYDROXYBUTYRIC ACID: Beta-Hydroxybutyric Acid: 1.66 mmol/L — ABNORMAL HIGH (ref 0.05–0.27)

## 2022-06-09 LAB — POCT FASTING CBG KUC MANUAL ENTRY: POCT Glucose (KUC): 232 mg/dL — AB (ref 70–99)

## 2022-06-09 MED ORDER — HALOPERIDOL LACTATE 5 MG/ML IJ SOLN
1.0000 mg | Freq: Once | INTRAMUSCULAR | Status: AC
  Administered 2022-06-09: 1 mg via INTRAVENOUS
  Filled 2022-06-09: qty 1

## 2022-06-09 MED ORDER — LACTATED RINGERS IV BOLUS
1000.0000 mL | Freq: Once | INTRAVENOUS | Status: AC
  Administered 2022-06-09: 1000 mL via INTRAVENOUS

## 2022-06-09 MED ORDER — MORPHINE SULFATE (PF) 4 MG/ML IV SOLN
4.0000 mg | Freq: Once | INTRAVENOUS | Status: AC
  Administered 2022-06-09: 4 mg via INTRAVENOUS
  Filled 2022-06-09: qty 1

## 2022-06-09 MED ORDER — DIPHENHYDRAMINE HCL 50 MG/ML IJ SOLN
25.0000 mg | Freq: Once | INTRAMUSCULAR | Status: AC
  Administered 2022-06-09: 25 mg via INTRAVENOUS
  Filled 2022-06-09: qty 1

## 2022-06-09 MED ORDER — ONDANSETRON 4 MG PO TBDP
4.0000 mg | ORAL_TABLET | Freq: Once | ORAL | Status: AC | PRN
  Administered 2022-06-09: 4 mg via ORAL
  Filled 2022-06-09: qty 1

## 2022-06-09 MED ORDER — METOCLOPRAMIDE HCL 10 MG PO TABS: 10.0000 mg | ORAL_TABLET | Freq: Four times a day (QID) | ORAL | 0 refills | Status: AC

## 2022-06-09 MED ORDER — METOCLOPRAMIDE HCL 5 MG/ML IJ SOLN
10.0000 mg | Freq: Once | INTRAMUSCULAR | Status: AC
  Administered 2022-06-09: 10 mg via INTRAVENOUS
  Filled 2022-06-09: qty 2

## 2022-06-09 MED ORDER — ONDANSETRON 4 MG PO TBDP: 8.0000 mg | ORAL_TABLET | Freq: Once | ORAL | Status: DC

## 2022-06-09 MED ORDER — IOHEXOL 350 MG/ML SOLN
70.0000 mL | Freq: Once | INTRAVENOUS | Status: AC | PRN
  Administered 2022-06-09: 70 mL via INTRAVENOUS

## 2022-06-09 NOTE — ED Provider Notes (Signed)
[facility] EMERGENCY DEPARTMENT Provider Note   CSN: [id number] Arrival date & time: 06/09/22  9629     History  Chief Complaint  Patient presents with   Abdominal Pain   Emesis   Nausea    Scott Barton is a 31 y.o. male.  plu   Abdominal Pain Associated symptoms: vomiting   Emesis Associated symptoms: abdominal pain      Patient with medical history of diabetes presents today due to abdominal pain, nausea, vomiting and diarrhea x2 days.  The started after eating steak and cheese fries, unable to tolerate anything by mouth since then.  Denies blood in his diarrhea, he has had streaky hematemesis starting today.  The abdominal pain is diffuse, worse in the upper abdomen.  Worsened by moving, vomiting or trying to eat.  No previous abdominal surgeries, states she has not been taking any of his diabetic medicine since he was admitted a year ago because he does not like the way it makes him feel.  No fevers, no recent travel, no known sick contacts.  Seen at urgent care, sent to ED.  Home Medications Prior to Admission medications   Medication Sig Start Date End Date Taking? Authorizing Provider  metoCLOPramide (REGLAN) 10 MG tablet Take 1 tablet (10 mg total) by mouth every 6 (six) hours. 06/09/22  Yes Sherrill Raring, PA-C  Accu-Chek Softclix Lancets lancets Use as directed. 02/19/21   Ghimire, Henreitta Leber, MD  Blood Glucose Monitoring Suppl (ACCU-CHEK GUIDE) w/Device KIT use as directed 02/19/21   Jonetta Osgood, MD  famotidine (PEPCID) 20 MG tablet Take 1 tablet (20 mg total) by mouth daily. 02/19/21 02/19/22  Ghimire, Henreitta Leber, MD  glucose blood (ACCU-CHEK GUIDE) test strip Use as instructed 02/19/21   Ghimire, Henreitta Leber, MD  metFORMIN (GLUCOPHAGE) 500 MG tablet Take 1 tablet (500 mg total) by mouth 2 (two) times daily with a meal. Patient not taking: Reported on 06/09/2022 02/19/21   Jonetta Osgood, MD  ondansetron (ZOFRAN ODT) 4 MG disintegrating tablet  Take 1 tablet (4 mg total) by mouth every 8 (eight) hours as needed for nausea or vomiting. Patient not taking: Reported on 06/09/2022 02/19/21   Jonetta Osgood, MD      Allergies    Patient has no known allergies.    Review of Systems   Review of Systems  Gastrointestinal:  Positive for abdominal pain and vomiting.    Physical Exam Updated Vital Signs BP (!) 171/93   Pulse 95   Temp 98.5 F (36.9 C) (Oral)   Resp 19   Ht '6\' 2"'  (5.28 m)   Wt 86.2 kg   SpO2 97%   BMI 24.39 kg/m  Physical Exam Vitals and nursing note reviewed. Exam conducted with a chaperone present.  Constitutional:      Appearance: Normal appearance. He is ill-appearing.  HENT:     Head: Normocephalic and atraumatic.  Eyes:     General: No scleral icterus.       Right eye: No discharge.        Left eye: No discharge.     Extraocular Movements: Extraocular movements intact.     Pupils: Pupils are equal, round, and reactive to light.  Cardiovascular:     Rate and Rhythm: Regular rhythm. Tachycardia present.     Pulses: Normal pulses.     Heart sounds: Normal heart sounds. No murmur heard.    No friction rub. No gallop.  Pulmonary:  Effort: Pulmonary effort is normal. No respiratory distress.     Breath sounds: Normal breath sounds.  Abdominal:     General: Abdomen is flat. Bowel sounds are normal. There is no distension.     Palpations: Abdomen is soft.     Tenderness: There is generalized abdominal tenderness.  Skin:    General: Skin is warm and dry.     Coloration: Skin is not jaundiced.  Neurological:     Mental Status: He is alert. Mental status is at baseline.     Coordination: Coordination normal.     ED Results / Procedures / Treatments   Labs (all labs ordered are listed, but only abnormal results are displayed) Labs Reviewed  COMPREHENSIVE METABOLIC PANEL - Abnormal; Notable for the following components:      Result Value   Potassium 3.2 (*)    CO2 19 (*)    Glucose, Bld  265 (*)    Total Bilirubin 1.7 (*)    Anion gap 16 (*)    All other components within normal limits  URINALYSIS, ROUTINE W REFLEX MICROSCOPIC - Abnormal; Notable for the following components:   Ketones, ur 40 (*)    All other components within normal limits  CBC WITH DIFFERENTIAL/PLATELET - Abnormal; Notable for the following components:   WBC 11.0 (*)    Hemoglobin 17.2 (*)    Neutro Abs 8.8 (*)    All other components within normal limits  CBG MONITORING, ED - Abnormal; Notable for the following components:   Glucose-Capillary 251 (*)    All other components within normal limits  I-STAT VENOUS BLOOD GAS, ED - Abnormal; Notable for the following components:   pH, Ven 7.518 (*)    pCO2, Ven 24.5 (*)    pO2, Ven 30 (*)    Bicarbonate 19.9 (*)    TCO2 21 (*)    Potassium 3.2 (*)    Calcium, Ion 1.09 (*)    All other components within normal limits  CBG MONITORING, ED - Abnormal; Notable for the following components:   Glucose-Capillary 245 (*)    All other components within normal limits  LIPASE, BLOOD  BETA-HYDROXYBUTYRIC ACID  CBG MONITORING, ED    EKG EKG Interpretation  Date/Time:  Thursday June 09 2022 14:32:15 EDT Ventricular Rate:  86 PR Interval:  140 QRS Duration: 89 QT Interval:  368 QTC Calculation: 441 R Axis:   30 Text Interpretation: Sinus rhythm Nonspecific T abnormalities, lateral leads Confirmed by Blanchie Dessert [phone]) on 06/09/2022 2:55:24 PM  Radiology CT ABDOMEN PELVIS W CONTRAST  Result Date: 06/09/2022 CLINICAL DATA:  Abdominal pain, nausea, vomiting x3 days EXAM: CT ABDOMEN AND PELVIS WITH CONTRAST TECHNIQUE: Multidetector CT imaging of the abdomen and pelvis was performed using the standard protocol following bolus administration of intravenous contrast. RADIATION DOSE REDUCTION: This exam was performed according to the departmental dose-optimization program which includes automated exposure control, adjustment of the mA and/or kV  according to patient size and/or use of iterative reconstruction technique. CONTRAST:  25m OMNIPAQUE IOHEXOL 350 MG/ML SOLN COMPARISON:  02/18/2021 FINDINGS: Lower chest: Unremarkable. Hepatobiliary: There is ill-defined low-density in liver close to falciform ligament with no significant interval change. This may suggest focal fatty infiltration or aberrant venous drainage. There is no dilation of bile ducts. Gallbladder is unremarkable. Pancreas: No focal abnormalities are seen. Spleen: Unremarkable. Adrenals/Urinary Tract: Adrenals are unremarkable. There is no hydronephrosis. There are no renal or ureteral stones. Urinary bladder is unremarkable. Stomach/Bowel: Stomach is not distended. Small bowel  loops are not dilated. Appendix is difficult to visualize. In image 66 of series 3, there is a small caliber tubular structure with tiny pocket of air in right side of pelvis, most likely normal appendix. There is no significant wall thickening in colon. There is no pericolic stranding. Vascular/Lymphatic: Unremarkable. Reproductive: Unremarkable. Other: There is no ascites or pneumoperitoneum. Musculoskeletal: Degenerative changes are noted with disc space narrowing and encroachment of neural foramina at L5-S1 level. IMPRESSION: There is no evidence of intestinal obstruction or pneumoperitoneum. There is no hydronephrosis. Degenerative changes are noted in lumbar spine, particularly prominent at L5-S1 level. Other findings as described in the body of the report. Electronically Signed   By: Elmer Picker M.D.   On: 06/09/2022 14:32    Procedures Procedures    Medications Ordered in ED Medications  ondansetron (ZOFRAN-ODT) disintegrating tablet 4 mg (4 mg Oral Given 06/09/22 1236)  lactated ringers bolus 1,000 mL (0 mLs Intravenous Stopped 06/09/22 1459)  morphine (PF) 4 MG/ML injection 4 mg (4 mg Intravenous Given 06/09/22 1332)  metoCLOPramide (REGLAN) injection 10 mg (10 mg Intravenous Given 06/09/22  1324)  diphenhydrAMINE (BENADRYL) injection 25 mg (25 mg Intravenous Given 06/09/22 1328)  haloperidol lactate (HALDOL) injection 1 mg (1 mg Intravenous Given 06/09/22 1325)  iohexol (OMNIPAQUE) 350 MG/ML injection 70 mL (70 mLs Intravenous Contrast Given 06/09/22 1417)  lactated ringers bolus 1,000 mL (1,000 mLs Intravenous New Bag/Given 06/09/22 1500)    ED Course/ Medical Decision Making/ A&P Clinical Course as of 06/09/22 1506  Thu Jun 09, 2022  1440 I reevaluated the patient, he reports feeling significantly improved after the antiemetics.  Abdominal pain is resolved, repeat abdominal exam is benign.  [HS]    Clinical Course User Index [HS] Sherrill Raring, PA-C                           Medical Decision Making Amount and/or Complexity of Data Reviewed Labs: ordered. Radiology: ordered.  Risk Prescription drug management.   Patient presents due to abdominal pain, nausea, vomiting and diarrhea x2 days.  Differential includes but not limited to AKI, dehydration, metabolic derangement, DKA, enteritis, SBO, pancreatitis, gastritis, otherwise tear, gastric ulcer  On exam patient is very anxious.  He is mildly tachycardic with a regular rate, he has diffuse abdominal tenderness with voluntary guarding but no focality.  Upper and lower extremity pulses are symmetric, lungs are clear to auscultation bilaterally.  I ordered, viewed and interpreted laboratory work-up. -CBC with leukocytosis of 11.  Stable hemoglobin without anemia. -CMP with mild hypokalemia with a potassium of 3.2.  Bicarb is very minimally decreased at 19, hyperglycemic at 265.  Bilirubin is mildly increased at 1.7, no transaminitis.  No AKI, anion gap of 16. -UA shows 40 ketones, no signs of UTI. -Lipase is within normal limits, not pancreatitis. -VBG shows alkalosis, bicarb is basically normal at 19.9. -Beta hydroxybutyric acid is pending  I reviewed patient's home medication list, he is medically noncompliant.  I ordered  morphine, Benadryl, Haldol, Reglan, Zofran and liter bolus of lactated Ringer's.  Patient is on cardiac monitoring showed a sinus tachycardia.  There are nonspecific T wave changes in the lateral leads.  Patient again denies any chest pain or shortness of breath.  I compared to EKG obtained in June 2022 and it does not look markedly changed from that time.  I ordered, viewed and interpreted CT abdomen pelvis with contrast.  I agree with radiologist interpretation.  Initially  considered hospitalization.  Patient is not acidotic, not in DKA although he is hyperglycemic.  CT abdomen does not show any acute process, there is no focal right lower quadrant pain so although not the best visualization of the appendix I do not think exam is consistent with appendicitis.  Discussed nonspecific findings of the liver, patient will follow-up with PCP.  Given symptom improvement, tolerating p.o. and work-up negative for acute intra-abdominal process or DKA. I do feel patient is appropriate for close outpatient follow-up with the PCP.  Return precautions were discussed, Reglan sent to outpatient pharmacy.        Final Clinical Impression(s) / ED Diagnoses Final diagnoses:  Nausea and vomiting, unspecified vomiting type  Hyperglycemia  Generalized abdominal pain    Rx / DC Orders ED Discharge Orders          Ordered    metoCLOPramide (REGLAN) 10 MG tablet  Every 6 hours        06/09/22 1457              Sherrill Raring, PA-C 06/09/22 1506    Blanchie Dessert, MD 06/11/22 2022

## 2022-06-09 NOTE — ED Notes (Signed)
Gave pt water 

## 2022-06-09 NOTE — ED Notes (Signed)
Pt. Stated, Im not able to take Zofran it makes me sick

## 2022-06-09 NOTE — ED Notes (Signed)
Pt was lying in the floor with stomach pains increasing pass the limit of 10 I asked the patient did he want a recliner or did he want to lay on the bench that would be more comfortable and the patient refused stated that lying in the floor felt better. I continued to observe the pt until he was triaged.

## 2022-06-09 NOTE — ED Notes (Signed)
Patient is being discharged from the Urgent Care and sent to the Emergency Department via POV . Per PA, patient is in need of higher level of care due to need of further evaluation. Patient is aware and verbalizes understanding of plan of care.  Vitals:   06/09/22 0833  BP: (!) 159/119  Pulse: (!) 128  Resp: (!) 26  Temp: (!) 97.5 F (36.4 C)  SpO2: 99%

## 2022-06-09 NOTE — ED Provider Notes (Signed)
[facility]    CSN: [id number] Arrival date & time: 06/09/22  2122      History   Chief Complaint Chief Complaint  Patient presents with   Abdominal Pain   Emesis    HPI Scott Barton is a 31 y.o. male.   Patient presents with abdominal pain, nausea, vomiting, diarrhea that started about 2 to 3 days ago.  Patient reports that he ate a sub sandwich prior to symptoms starting.  He reports 10/10 abdominal pain that is constant and mainly in the left upper quadrant.  Patient reports that he had episode of bloody emesis yesterday as well.  Denies blood in stool.  Patient reports he has not been able to keep any food or fluids down.  Denies any known fevers or sick contacts.  Patient was diagnosed with diabetes approximately 1 year ago when he had DKA, but the patient reports that he does not think it is an accurate diagnosis as he does not take any of his medications.   Abdominal Pain Emesis   Past Medical History:  Diagnosis Date   Diabetes mellitus without complication [facility])     Patient Active Problem List   Diagnosis Date Noted   Nausea & vomiting 02/18/2021   Hyperglycemia due to diabetes mellitus ([place]) 02/18/2021    History reviewed. No pertinent surgical history.     Home Medications    Prior to Admission medications   Medication Sig Start Date End Date Taking? Authorizing Provider  Accu-Chek Softclix Lancets lancets Use as directed. 02/19/21   Ghimire, Henreitta Leber, MD  Blood Glucose Monitoring Suppl (ACCU-CHEK GUIDE) w/Device KIT use as directed 02/19/21   Jonetta Osgood, MD  famotidine (PEPCID) 20 MG tablet Take 1 tablet (20 mg total) by mouth daily. 02/19/21 02/19/22  Ghimire, Henreitta Leber, MD  glucose blood (ACCU-CHEK GUIDE) test strip Use as instructed 02/19/21   Ghimire, Henreitta Leber, MD  metFORMIN (GLUCOPHAGE) 500 MG tablet Take 1 tablet (500 mg total) by mouth 2 (two) times daily with a meal. Patient not taking: Reported on 06/09/2022 02/19/21    Jonetta Osgood, MD  ondansetron (ZOFRAN ODT) 4 MG disintegrating tablet Take 1 tablet (4 mg total) by mouth every 8 (eight) hours as needed for nausea or vomiting. Patient not taking: Reported on 06/09/2022 02/19/21   Jonetta Osgood, MD    Family History Family History  Problem Relation Age of Onset   Diabetes Mother    Kidney disease Mother        kidney transplant   Diabetes Sister     Social History Social History   Tobacco Use   Smoking status: Never   Smokeless tobacco: Never  Substance Use Topics   Alcohol use: Yes    Comment: occ   Drug use: Yes    Types: Marijuana    Comment: everyday except last 3 days     Allergies   Patient has no known allergies.   Review of Systems Review of Systems Per HPI  Physical Exam Triage Vital Signs ED Triage Vitals  Enc Vitals Group     BP 06/09/22 0833 (!) 159/119     Pulse Rate 06/09/22 0833 (!) 128     Resp 06/09/22 0833 (!) 26     Temp 06/09/22 0833 (!) 97.5 F (36.4 C)     Temp Source 06/09/22 0833 Oral     SpO2 06/09/22 0833 99 %     Weight --      Height --  Head Circumference --      Peak Flow --      Pain Score 06/09/22 0839 10     Pain Loc --      Pain Edu? --      Excl. in [place]? --    No data found.  Updated Vital Signs BP (!) 159/119 (BP Location: Left Arm)   Pulse (!) 128   Temp (!) 97.5 F (36.4 C) (Oral)   Resp (!) 26   SpO2 99%   Visual Acuity Right Eye Distance:   Left Eye Distance:   Bilateral Distance:    Right Eye Near:   Left Eye Near:    Bilateral Near:     Physical Exam Constitutional:      General: He is in acute distress.     Appearance: Normal appearance. He is ill-appearing and diaphoretic. He is not toxic-appearing.     Comments: Patient is in the fetal position in the floor during physical exam due to pain.  HENT:     Head: Normocephalic and atraumatic.  Eyes:     Extraocular Movements: Extraocular movements intact.     Conjunctiva/sclera: Conjunctivae  normal.  Cardiovascular:     Rate and Rhythm: Regular rhythm. Tachycardia present.     Pulses: Normal pulses.     Heart sounds: Normal heart sounds.  Pulmonary:     Effort: Pulmonary effort is normal. No respiratory distress.     Breath sounds: Normal breath sounds.  Abdominal:     General: Abdomen is flat. Bowel sounds are normal. There is no distension.     Palpations: Abdomen is soft.     Tenderness: There is abdominal tenderness in the right lower quadrant and left upper quadrant.     Comments: Patient is exquisitely tender to palpation to left upper quadrant and right lower quadrant.  Neurological:     General: No focal deficit present.     Mental Status: He is alert and oriented to person, place, and time. Mental status is at baseline.  Psychiatric:        Mood and Affect: Mood normal.        Behavior: Behavior normal.        Thought Content: Thought content normal.        Judgment: Judgment normal.      UC Treatments / Results  Labs (all labs ordered are listed, but only abnormal results are displayed) Labs Reviewed  POCT FASTING CBG [facility] MANUAL ENTRY - Abnormal; Notable for the following components:      Result Value   POCT Glucose ([facility]) 232 (*)    All other components within normal limits    EKG   Radiology No results found.  Procedures Procedures (including critical care time)  Medications Ordered in UC Medications - No data to display  Initial Impression / Assessment and Plan / UC Course  I have reviewed the triage vital signs and the nursing notes.  Pertinent labs & imaging results that were available during my care of the patient were reviewed by me and considered in my medical decision making (see chart for details).     Patient appears to be in significant abdominal pain as he is in the fetal position in the exam room.  He is also very exquisitely tender to palpation on exam.  Patient's blood glucose is 232 as well.  He has also had reports of  bloody emesis.  Patient's blood pressure is also elevated as well as he has some tachycardia.  Given all of these factors, I do think this warrants further evaluation and management at the emergency department.  Patient was agreeable to going to the hospital.  Suggested EMS transport but patient declined.  Risks associated with not going by EMS were discussed with patient.  Patient voiced understanding and wished to self transport.  Patient left via self transport. Final Clinical Impressions(s) / UC Diagnoses   Final diagnoses:  Continuous severe abdominal pain  Hematemesis with nausea  Hyperglycemia     Discharge Instructions      Please go to the emergency department as soon as you leave urgent care for further evaluation and management.    ED Prescriptions   None    PDMP not reviewed this encounter.   Teodora Medici, [place] 06/09/22 [phone]

## 2022-06-09 NOTE — ED Triage Notes (Signed)
Pt c/o LUQ pain with n/v x3 days. States last time this happened he was dx with type 1 DM and didn't believe them so isn't on any tx's.

## 2022-06-09 NOTE — ED Triage Notes (Signed)
Pt. Stated, Ivwe had stomach pain with N/V for 2 days.

## 2022-06-09 NOTE — ED Provider Notes (Signed)
4:41 PM I was informed by nursing staff that patient blood pressure was elevated, I evaluated patient.  Systolic in the 932I, diastolic in the 712W.  Same pressure has held up through his entire visit.  I did review prior records and I see his normal baseline was around the 580D systolic, not diagnosed with history of high blood pressure in the past.  I did speak to his throughout the bedside who reports patient gets very worked up after being in the hospital, stating that their mother passed away at [facility] in the emergency department in the month of April.  Patient is denying any chest pain, no shortness of breath, no headaches.  He did spike up when I entered the room to the 113's.  Appear has been reassuring he is not in DKA, he was suspect that he likely ate some bad food.  He is requesting discharge home at this time.  Patient hemodynamically stable for discharge.   Portions of this note were generated with [employer]. Dictation errors may occur despite best attempts at proofreading.     Janeece Fitting, PA-C 06/09/22 [place], [place], DO 06/09/22 2040

## 2022-06-09 NOTE — ED Notes (Addendum)
Pt lying in floor in ED lobby with nausea.  Offered him a recliner and he refused.  Explained to patient that he is unable to lay in the floor and he got up into chair.  States he has not received any nausea medication.  Order placed under protocol.  Vitals reassessed and working on finding a treatment room for pt.

## 2022-06-09 NOTE — ED Provider Triage Note (Signed)
Emergency Medicine Provider Triage Evaluation Note  Scott Barton , a 31 y.o. male  was evaluated in triage.  Pt complains of abdominal pain for the past 2 days with persistent nausea and vomiting.  Last episode of emesis had dark blood.  He was recently admitted and diagnosed with new onset diabetes.  He refused to start insulin.  He was started on metformin which he also endorses noncompliance with.  Denies blood in stool.  Review of Systems  Positive: As above Negative: As above  Physical Exam  BP (!) 168/108 (BP Location: Right Arm)   Pulse (!) 119   Temp 97.9 F (36.6 C) (Oral)   Resp (!) 24   Ht 6\' 2"  (1.88 m)   Wt 86.2 kg   SpO2 100%   BMI 24.39 kg/m  Gen:   Awake, no distress   Resp:  Normal effort  MSK:   Moves extremities without difficulty  Other:    Medical Decision Making  Medically screening exam initiated at 9:53 AM.  Appropriate orders placed.  Scott Barton was informed that the remainder of the evaluation will be completed by another provider, this initial triage assessment does not replace that evaluation, and the importance of remaining in the ED until their evaluation is complete.    Evlyn Courier, PA-C 06/09/22 [phone]

## 2022-06-09 NOTE — Discharge Instructions (Addendum)
You are seen today for abdominal pain, nausea, vomiting and diarrhea.  The CT of your abdomen was reassuring did not show any emergent process.  Your sugar is elevated which would be suggestive of diabetes, you do not need to have the hospital for underlying DKA.  I suspect he likely have a stomach virus.  Drink plenty of fluids, please follow-up with a primary care doctor regarding your diabetes.  Information establish care is provided above.  You can take the Reglan as prescribed for nausea and vomiting, take Tylenol for pain.  Return to the ED if the pain localizes, you are unable to eat or drink without vomiting or you have new concerning symptoms.

## 2022-06-09 NOTE — Discharge Instructions (Signed)
Please go to the emergency department as soon as you leave urgent care for further evaluation and management. ?

## 2022-09-24 IMAGING — CT CT ABD-PELV W/ CM
2 of 5 series · 16 of 46 positions shown, 18 images · IV contrast (APPLIED)
Comparison: Radiograph 02/18/2021

CLINICAL DATA: Acute abdominal pain

EXAM:
CT ABDOMEN AND PELVIS WITH CONTRAST
TECHNIQUE: Multidetector CT imaging of the abdomen and pelvis was performed
using the standard protocol following bolus administration of
intravenous contrast.
CONTRAST:  100mL OMNIPAQUE IOHEXOL 300 MG/ML  SOLN

[Series 3: abdomen 5.0 · axial · 0.73mm/px · z∈[-539,-119]mm · 13 of 99 slices shown, 15 images]
[im 8/99  soft-tissue]
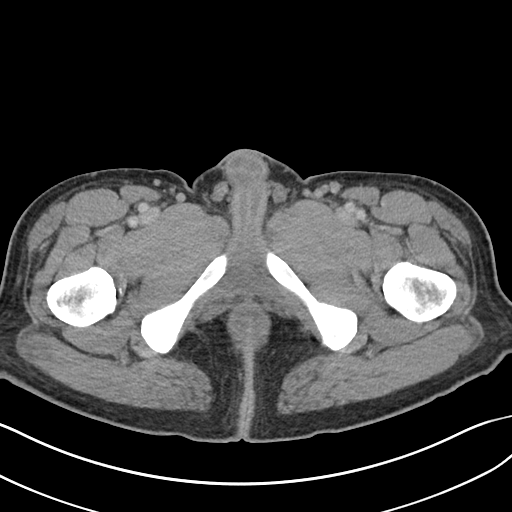
[im 8/99  bone]
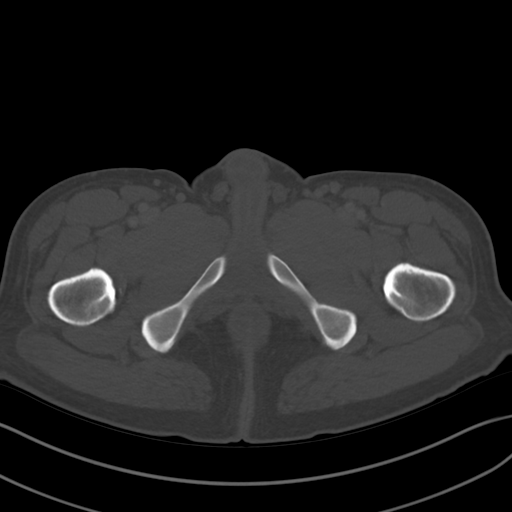
[im 15/99  soft-tissue]
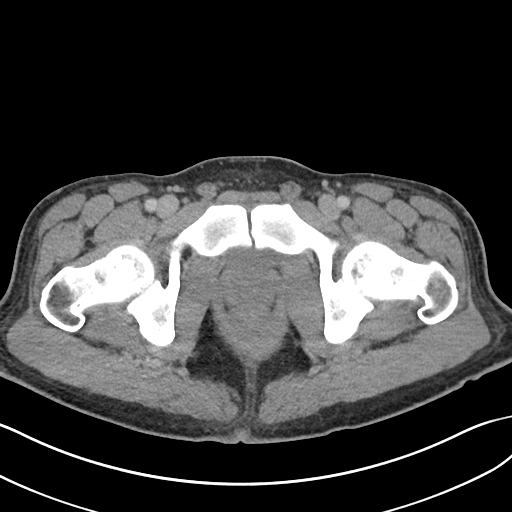
[im 22/99  soft-tissue]
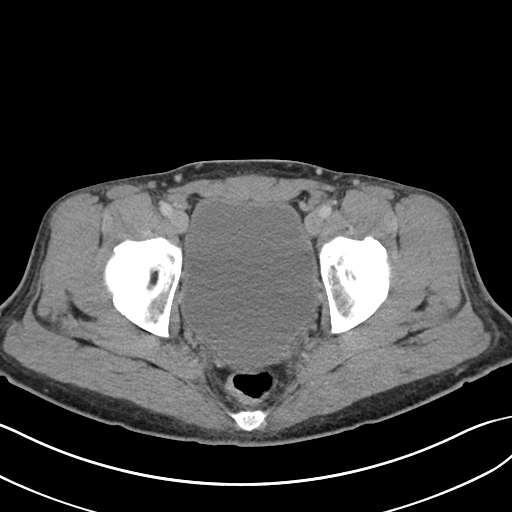
[im 29/99  soft-tissue]
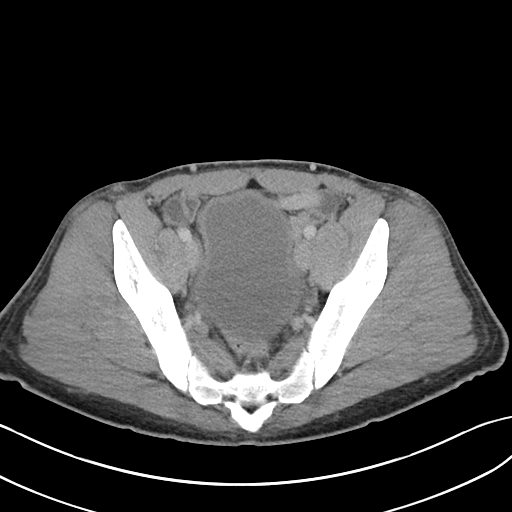
[im 36/99  soft-tissue]
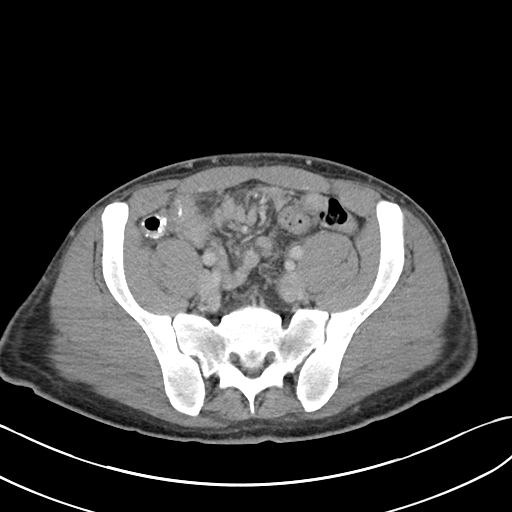
[im 43/99  soft-tissue]
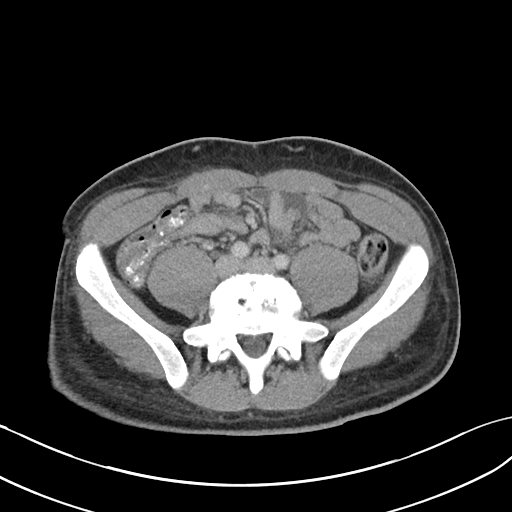
[im 50/99  soft-tissue]
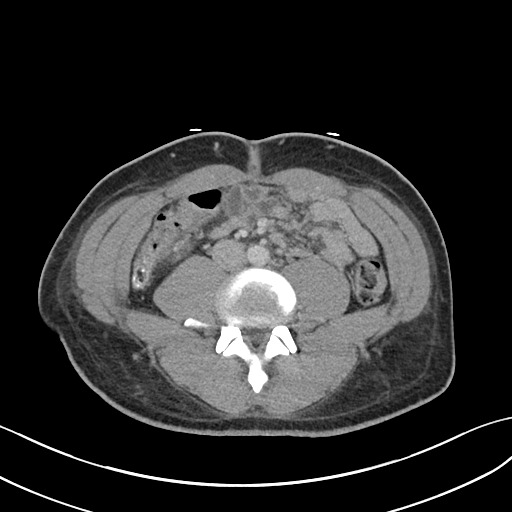
[im 57/99  soft-tissue]
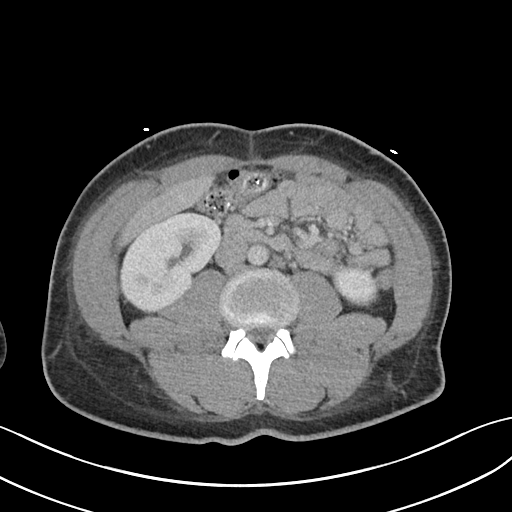
[im 64/99  soft-tissue]
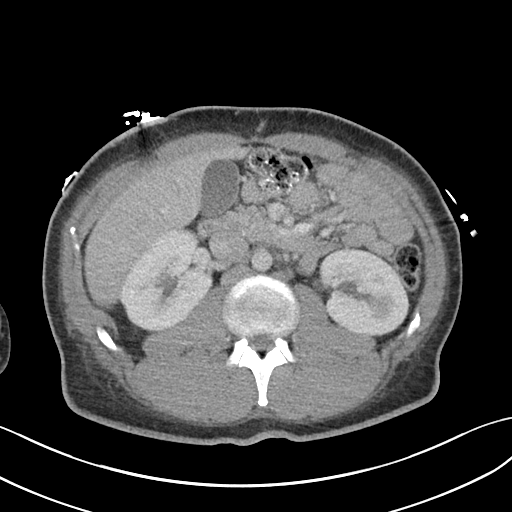
[im 64/99  bone]
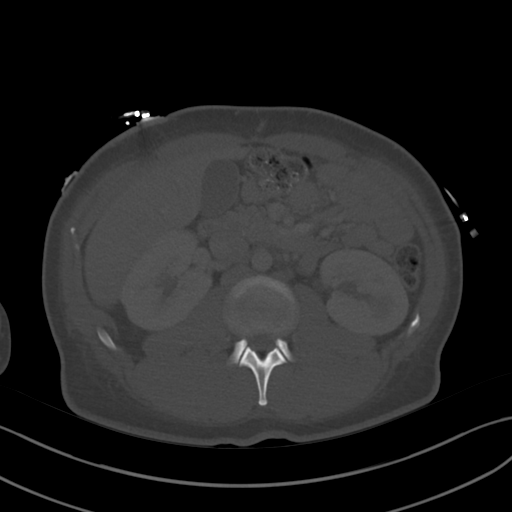
[im 71/99  soft-tissue]
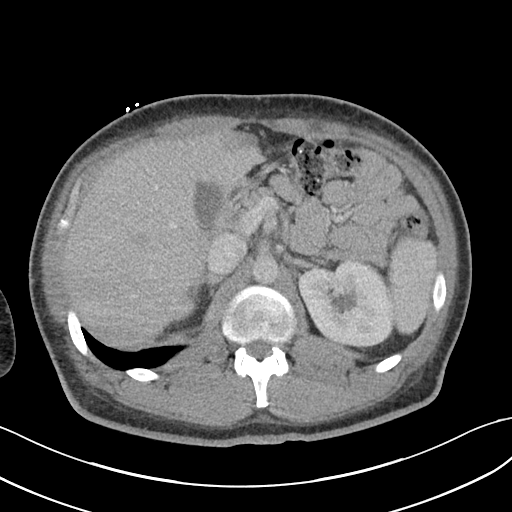
[im 78/99  soft-tissue]
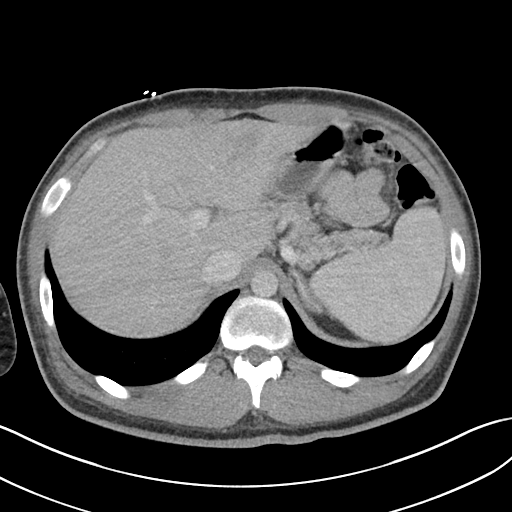
[im 85/99  soft-tissue]
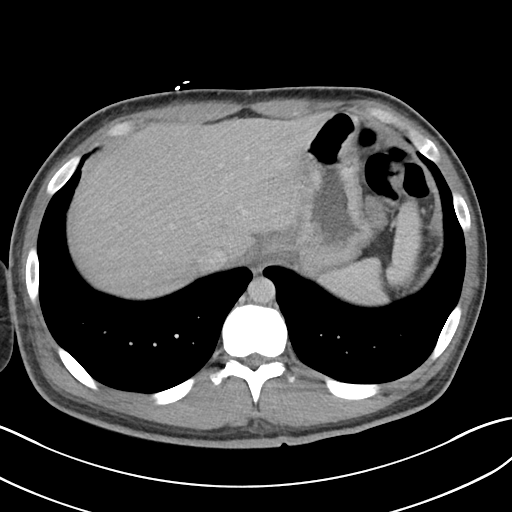
[im 92/99  soft-tissue]
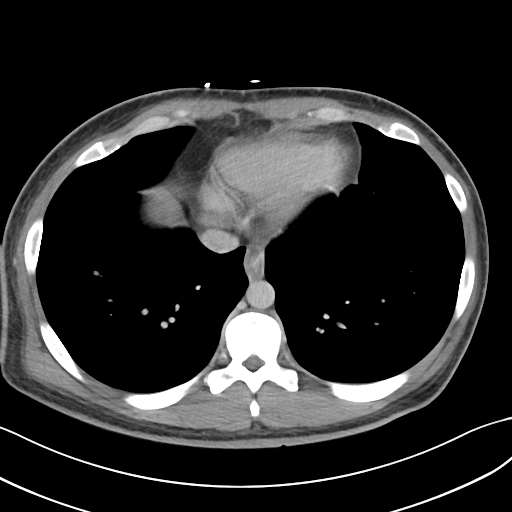

[Series 6: abdomen 3.0 mpr cor · coronal · 0.69mm/px · 3 of 89 slices shown]
[im 30/89  soft-tissue]
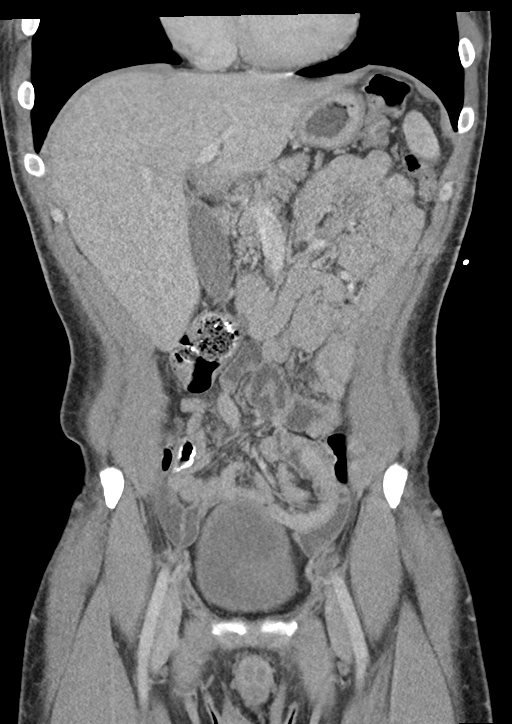
[im 40/89  soft-tissue]
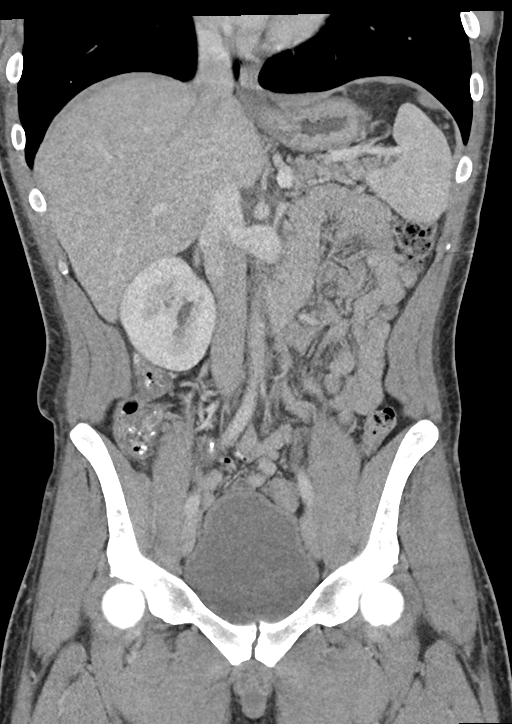
[im 49/89  soft-tissue]
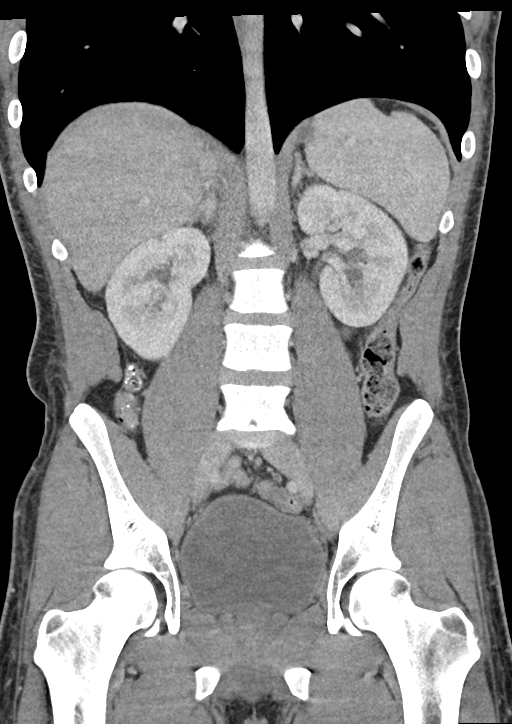

[16 of 46 positions shown; findings below may reference images not displayed]

FINDINGS: Lower chest: No significant abnormality.

Hepatobiliary: Hypodensity in the left hepatic lobe along the
falciform ligament persists on delayed phase images and is probably
from focal steatosis given the location, although is technically
nonspecific. Gallbladder unremarkable.

Pancreas: Unremarkable

Spleen: Unremarkable

Adrenals/Urinary Tract: Unremarkable

Stomach/Bowel: Unremarkable.  Appendix appears normal.

Vascular/Lymphatic: Unremarkable

Reproductive: Unremarkable

Other: No supplemental non-categorized findings.

Musculoskeletal: Degenerative disc disease at L5-S1, advanced for
age. Congenitally short pedicles in the lumbar spine along with
degenerative disc disease resulting in impingement bilaterally at
L5-S1 and potentially also at L3-4 and L4-5. Schmorl's node along
the superior endplate of S1. Mildly transitional S1 vertebra.
IMPRESSION: 1. A specific cause for the patient's abdominal pain is not
identified.
2. Congenitally short pedicles in the lumbar spine with degenerative
disc disease resulting in impingement at L5-S1 and potentially also
at L3-4 and L4-5.
3. Hypodensity in the left hepatic lobe along the falciform ligament
probably represents focal steatosis given this location, although is
technically nonspecific.
